# Patient Record
Sex: Male | Born: 1965
Health system: Southern US, Community
[De-identification: ages and names within clinical notes are randomized; demographics above are authoritative.]

## PROBLEM LIST (undated history)

## (undated) DIAGNOSIS — I10 Essential (primary) hypertension: Secondary | ICD-10-CM

## (undated) DIAGNOSIS — K5790 Diverticulosis of intestine, part unspecified, without perforation or abscess without bleeding: Secondary | ICD-10-CM

## (undated) DIAGNOSIS — K219 Gastro-esophageal reflux disease without esophagitis: Secondary | ICD-10-CM

## (undated) HISTORY — PX: HERNIA REPAIR: SHX51

## (undated) HISTORY — DX: Diverticulosis of intestine, part unspecified, without perforation or abscess without bleeding: K57.90

---

## 2006-10-04 ENCOUNTER — Emergency Department (HOSPITAL_COMMUNITY): Admission: EM | Admit: 2006-10-04 | Discharge: 2006-10-04 | Payer: Self-pay | Admitting: Emergency Medicine

## 2007-09-25 ENCOUNTER — Observation Stay (HOSPITAL_COMMUNITY): Admission: EM | Admit: 2007-09-25 | Discharge: 2007-09-25 | Payer: Self-pay | Admitting: Emergency Medicine

## 2007-12-16 ENCOUNTER — Emergency Department (HOSPITAL_COMMUNITY): Admission: EM | Admit: 2007-12-16 | Discharge: 2007-12-17 | Payer: Self-pay | Admitting: Emergency Medicine

## 2008-02-28 ENCOUNTER — Emergency Department (HOSPITAL_COMMUNITY): Admission: EM | Admit: 2008-02-28 | Discharge: 2008-02-28 | Payer: Self-pay | Admitting: Emergency Medicine

## 2008-04-30 ENCOUNTER — Ambulatory Visit (HOSPITAL_BASED_OUTPATIENT_CLINIC_OR_DEPARTMENT_OTHER): Admission: RE | Admit: 2008-04-30 | Discharge: 2008-04-30 | Payer: Self-pay | Admitting: Urology

## 2009-09-01 ENCOUNTER — Encounter: Admission: RE | Admit: 2009-09-01 | Discharge: 2009-09-01 | Payer: Self-pay | Admitting: Gastroenterology

## 2010-05-05 LAB — POCT I-STAT 4, (NA,K, GLUC, HGB,HCT): Sodium: 139 mEq/L (ref 135–145)

## 2010-05-11 LAB — CBC
Hemoglobin: 15.1 g/dL (ref 13.0–17.0)
MCHC: 34.8 g/dL (ref 30.0–36.0)
RBC: 4.6 MIL/uL (ref 4.22–5.81)
RDW: 12.4 % (ref 11.5–15.5)

## 2010-05-11 LAB — URINE MICROSCOPIC-ADD ON

## 2010-05-11 LAB — BASIC METABOLIC PANEL
CO2: 26 mEq/L (ref 19–32)
Chloride: 103 mEq/L (ref 96–112)
GFR calc non Af Amer: >60 mL/min (ref >60)
Glucose, Bld: 130 mg/dL — ABNORMAL HIGH (ref 70–99)
Sodium: 138 mEq/L (ref 135–145)

## 2010-05-11 LAB — URINALYSIS, ROUTINE W REFLEX MICROSCOPIC
Bilirubin Urine: NEGATIVE
Nitrite: NEGATIVE
Urobilinogen, UA: 0.2 mg/dL (ref 0.0–1.0)

## 2010-05-11 LAB — DIFFERENTIAL
Eosinophils Absolute: 0.1 10*3/uL (ref 0.0–0.7)
Eosinophils Relative: 2 % (ref 0–5)
Monocytes Absolute: 0.7 10*3/uL (ref 0.1–1.0)
Monocytes Relative: 10 % (ref 3–12)
Neutro Abs: 4.3 10*3/uL (ref 1.7–7.7)
Neutrophils Relative %: 67 % (ref 43–77)

## 2010-06-08 NOTE — Consult Note (Signed)
NAME:  Michael Peters, Michael Peters NO.:  [id number]   MEDICAL RECORD NO.:  [id number]          PATIENT TYPE:  INP   LOCATION:  1418                         FACILITY:  [facility]   PHYSICIAN:  Francisca December, M.D.  DATE OF BIRTH:  [birth year]   DATE OF CONSULTATION:  09/25/2007  DATE OF DISCHARGE:  09/25/2007                                 CONSULTATION   REASON FOR CONSULT:  Chest pain.   HISTORY OF PRESENT ILLNESS:  Mr. Arp is a 45 year old male patient  with multiple cardiac risk factors including hypertension,  hyperlipidemia, tobacco abuse, as well as an inactive lifestyle.  Yesterday, he developed a sensation of not feeling right and presented  to his primary care doctor who after obtaining an EKG told the patient  that it was not completely normal.  Apparently, his blood pressure was  elevated and he was placed on a lisinopril/hydrochlorothiazide 20/12.5  mg dose and was told to the patient that he would probably need to see a  cardiologist at some point.   The patient then went home.  He then developed chest pressure associated  with diaphoresis, nausea, and anxiety.  He then presented to the  emergency room and his discomfort resolved without any specific reason  for resolution.  No other symptoms.   REVIEW OF SYSTEMS:  Otherwise negative.   PAST MEDICAL HISTORY:  1. Hypertension.  2. Hyperlipidemia.  3. Tobacco abuse.   MEDICATIONS PRIOR TO ADMISSION:  Lisinopril/HCTZ 20/12.5 mg 1 p.o.  daily.   In the hospital, his medications have been:  1. Lopressor 12.5 mg p.o. q.6 h.  2. Lovenox 40 mg subcu q.24 hours.  3. Enteric-coated aspirin 325 mg 1 p.o. daily.   ALLERGIES:  None.   SOCIAL HISTORY:  He smokes 1-pack of cigarettes per day, drinking only  on the weekend.  He denies use of illicit drugs.  He lives in  [place].   FAMILY HISTORY:  The patient is adopted.   PHYSICAL EXAMINATION:  VITAL SIGNS:  Blood pressure 145/80, temp 97.8,  pulse 77,  respirations 14, O2 saturations 99% on room air, and weight  105 kg.  HEENT:  Grossly normal.  No carotid or subclavian bruits.  No JVD or  thyromegaly.  Sclerae clear.  Conjunctivae normal.  Nares without  drainage.  CHEST:  Clear to auscultation bilaterally.  No wheezing or rhonchi.  HEART:  Regular rate and rhythm.  No evidence of murmur.  ABDOMEN:  Good bowel sounds, nontender, nondistended.  No mass.  No  bruit.  LOWER EXTREMITIES:  No lower extremity edema.  SKIN:  Warm and dry.  NEUROLOGIC:  Cranial nerves II through XII grossly intact.  Normal mood  and affect.   Labs today showed BNP of less than 30, total cholesterol 207,  triglycerides 155, HDL 33, and LDL 143.  CK-MB 92 with a MB fraction of  2.0, and troponin 0.02.  There has only been one set of enzyme so far.  Sodium 139, potassium 3.9, BUN 11, and creatinine 0.75.  D-dimer 0.27.  PT 12.8, INR 1.0, and PTT 26.  Point-of-care markers negative x1.  Hemoglobin 15.9, hematocrit 45.5, white count 10.0, and platelets 187.   Chest x-ray, coarse central bronchovascular markings suggesting a viral  process.   ASSESSMENT AND PLAN:  1. Atypical angina with multiple cardiac risk factors.  2. Hypertension.  3. Hyperlipidemia.  4. Tobacco abuse.  5. Inactive lifestyle.  6. Abnormal EKG with inferior Q waves in 2 leads, but no acute ST      abnormality.   Agreed with checking second and third set of enzymes.  If these are  negative, it is okay to discharge him to home from a cardiovascular  standpoint.  He will need outpatient stress Cardiolite later this week.  If his enzymes were positive, we will need to keep him overnight for  diagnostic cardiac catheterization.  Also, recommend exercises as well  as strict heart-healthy diet and probable entire lipid lowering agents  for his hyperlipidemia.  He is to be following up with his primary care  physician, Dr. Deatra Quasean.      Guy Franco, P.A.      Francisca December,  M.D.  Electronically Signed    LB/MEDQ  D:  09/25/2007  T:  09/26/2007  Job:  [id number]   cc:   Deatra Fahad, M.D.  Francisca December, M.D.

## 2010-06-08 NOTE — H&P (Signed)
NAME:  Michael Peters, Michael Peters NO.:  [id number]   MEDICAL RECORD NO.:  [id number]          PATIENT TYPE:  EMS   LOCATION:  ED                           FACILITY:  [facility]   PHYSICIAN:  Michiel Cowboy, MDDATE OF BIRTH:  1965-05-24   DATE OF ADMISSION:  09/25/2007  DATE OF DISCHARGE:                              HISTORY & PHYSICAL   PRIMARY CARE PHYSICIAN:  Deatra Onie, M.D.   CHIEF COMPLAINT:  Chest pain.   HISTORY OF PRESENT ILLNESS:  The patient is a 45 year old male with a  past medical history of hypertension for which he was not taking any  medications up until yesterday when he developed the sensation of not  feeling quite right, although he cannot put his finger on it.  He  describes it as feeling spacey.  He presented to his primary care  doctor, who obtained an EKG and told the patient that it was not  completely normal, started him on lisinopril with hydrochlorothiazide  20/12.5 mg, and also told him that he would probably need to see a  cardiologist at some point.  After the patient went home last night, he  developed chest pressure, which he describes as mild but associated with  diaphoresis, nausea, dry heaving but no vomiting and sensation of  anxiety or tremulousness.  The sensation came and went until he  presented to the emergency department, and resolved when he was in the  emergency department.  Unsure what made it better.  He denies any  shortness of breath associated with it, but it did scare him a lot.  Otherwise, he denies any fevers, chills, vomiting.  He does report some  loose stools x1, but no definite diarrhea or constipation, abdominal  pain.  Neurologically no complaints.  No urinary complaints.  Otherwise,  review of systems x12 is negative, except for as stated above.   PAST MEDICAL HISTORY:  Hypertension.   CURRENT MEDICATIONS:  1. Lisinopril.  2. Hydrochlorothiazide 20/12.5.   ALLERGIES:  None.   SOCIAL HISTORY:  The patient  smokes a pack a day.  Drinks on the  weekends, Bud Light.  Does not use drugs.  Lives at home.   FAMILY HISTORY:  Unable to obtain, as the patient is adopted.   PHYSICAL EXAMINATION:  VITAL SIGNS:  Temperature 97.5, blood pressure  171/68, pulse 92, respirations 18, satting 98% on room air.  GENERAL:  The patient appears to be currently sleeping, in no acute  distress.  HEENT:  Head non-traumatic.  Moist mucous membranes.  No JVD noted.  LUNGS:  Clear to auscultation bilaterally.  HEART:  Regular rate and rhythm.  No murmurs, rubs or gallops are noted.  ABDOMEN:  Soft, nontender, nondistended.  EXTREMITIES:  Lower extremities without any edema.  NEUROLOGIC:  Nonfocal.  Cranial nerves II-XII intact.   LABORATORY DATA:  White blood cell count 10.1, hemoglobin 16.3.  Sodium  149, potassium 3.8, creatinine 0.9.  Cardiac enzymes negative, point of  care markers.  Chest x-ray not obtained.  EKG showing Q waves in leads  III and aVF, but otherwise  unremarkable.   ASSESSMENT AND PLAN:  This is a 45 year old gentleman with risk factors  of smoking and hypertension who comes in with somewhat atypical chest  pain.  1. Chest pain.  Will put on telemetry, obtain serial cardiac markers      and EKG.  __________fasting lipid panel, hemoglobin A1c, TSH.  The      patient will likely need a stress test as an outpatient or      inpatient if chest pain recurs.  2. Hypertension.  Will hold the lisinopril/hydrochlorothiazide and try      __________ beta blocker at this point.  Will also start the patient      on __________.  3. Prophylaxis.  Protonix and Lovenox.      Michiel Cowboy, MD  Electronically Signed     AVD/MEDQ  D:  09/25/2007  T:  09/25/2007  Job:  [id number]   cc:   Deatra Erikson, M.D.  Fax: [phone]

## 2010-06-08 NOTE — Op Note (Signed)
NAME:  Michael Peters, Michael Peters NO.:  [id number]   MEDICAL RECORD NO.:  [id number]          PATIENT TYPE:  AMB   LOCATION:  [facility]                         FACILITY:  [facility]   PHYSICIAN:  Valetta Fuller, M.D.  DATE OF BIRTH:  August 21, 1965   DATE OF PROCEDURE:  DATE OF DISCHARGE:                               OPERATIVE REPORT   PREOPERATIVE DIAGNOSIS:  A 6-mm left distal ureteral calculus.   POSTOPERATIVE DIAGNOSIS:  A 6-mm left distal ureteral calculus.   PROCEDURE PERFORMED:  Cystoscopy, left retrograde pyelogram, left  ureteroscopy, holmium laser lithotripsy, basketing the stone fragments,  and placement of a double-J stent.   SURGEON:  Valetta Fuller, M.D.   ANESTHESIA:  General.   INDICATIONS:  Michael Peters is a 45 year old male.  He has had a documented  6-mm left ureteral stone for approximately 2 months.  He has done fairly  well clinically but has intermittently had mild discomfort and ongoing  hematuria.  Due to failure of spontaneous passage, we have recommended  intervention and suggested ureteroscopy with holmium laser is the most  appropriate therapy.  The patient appeared to understand the advantages  and disadvantages of this treatment and is here now for the procedure.  He has received perioperative antibiotics.   TECHNIQUE AND FINDINGS:  The patient was brought to the operating room.  Appropriate patient identification and operative time-out was performed.  The patient had successful induction of general anesthesia and was  placed in the lithotomy position and prepped and draped in the usual  manner.  The patient had some meatal stenosis which required dilation  with meatal dilators/van Buren sounds.  Cystoscopy then revealed an  unremarkable bladder.  Retrograde pyelogram with a cone-tip catheter  showed a very tight left ureteral orifice.  A 6-mm filling defect was  encountered about 3 cm above the ureterovesical junction with minimal  ureteral dilation.   A guidewire was passed beyond the stone without any  difficulty.  The inside portion of an access sheath was used to perform  one-step ureteral dilation to the level of the stone.  The left distal  ureter was then easily engaged with the rigid ureteroscope and, again, a  6- to 7-mm stone was encountered in the distal ureter.  This was then  placed in a laser basket.  A holmium laser lithotriptor fiber was then  used to break the stone into approximately half a dozen pieces.  The  pieces were then basket extracted and will be sent for stone analysis.  Because of the need for ureteral dilation, we felt it appropriate to  leave a double-J stent.  A 6-French 24-cm double-J stent was then placed  with direct visual guidance as well as fluoroscopic guidance.  A dangle  string was left, which was secured to the patient's penis.  He was  brought to the recovery room in stable condition.      Valetta Fuller, M.D.  Electronically Signed    DSG/MEDQ  D:  04/30/2008  T:  04/30/2008  Job:  [id number]

## 2010-10-18 ENCOUNTER — Other Ambulatory Visit: Payer: Self-pay | Admitting: Family Medicine

## 2010-10-18 DIAGNOSIS — R1032 Left lower quadrant pain: Secondary | ICD-10-CM

## 2010-10-20 ENCOUNTER — Ambulatory Visit
Admission: RE | Admit: 2010-10-20 | Discharge: 2010-10-20 | Disposition: A | Discharge status: Home / Self Care | Payer: No Typology Code available for payment source | Source: Ambulatory Visit | Attending: Family Medicine | Admitting: Family Medicine

## 2010-10-20 DIAGNOSIS — R1032 Left lower quadrant pain: Secondary | ICD-10-CM

## 2010-10-20 MED ORDER — IOHEXOL 300 MG/ML  SOLN: 125.0000 mL | Freq: Once | INTRAMUSCULAR | Status: AC | PRN

## 2010-10-26 LAB — COMPREHENSIVE METABOLIC PANEL
AST: 28
Albumin: 4.2
BUN: 15
Calcium: 9.2
Chloride: 108
Sodium: 141
Total Protein: 6.8

## 2010-10-26 LAB — CBC
Hemoglobin: 15.1
MCV: 95.6
Platelets: 197
RDW: 12.5
WBC: 9.3

## 2010-10-26 LAB — DIFFERENTIAL: Lymphs Abs: 1.8

## 2010-10-26 LAB — POCT CARDIAC MARKERS
CKMB, poc: 1.3
Troponin i, poc: <0.05
Troponin i, poc: <0.05

## 2010-10-27 LAB — LIPID PANEL
HDL: 33 — ABNORMAL LOW
LDL Cholesterol: 143 — ABNORMAL HIGH
Total CHOL/HDL Ratio: 6.3
Triglycerides: 155 — ABNORMAL HIGH
VLDL: 31

## 2010-10-27 LAB — DIFFERENTIAL
Basophils Relative: 0
Eosinophils Relative: 1
Lymphocytes Relative: 16
Lymphs Abs: 1.6
Monocytes Absolute: 0.7
Neutrophils Relative %: 76

## 2010-10-27 LAB — COMPREHENSIVE METABOLIC PANEL
Alkaline Phosphatase: 73
BUN: 11
Calcium: 9.4
Glucose, Bld: 106 — ABNORMAL HIGH
Potassium: 3.9
Total Protein: 6.9

## 2010-10-27 LAB — D-DIMER, QUANTITATIVE: D-Dimer, Quant: 0.27

## 2010-10-27 LAB — CBC
HCT: 45.5
MCHC: 35.1
Platelets: 187
RBC: 4.78
RDW: 12.6

## 2010-10-27 LAB — CARDIAC PANEL(CRET KIN+CKTOT+MB+TROPI)
CK, MB: 1.7
Total CK: 74
Total CK: 83

## 2010-10-27 LAB — POCT I-STAT, CHEM 8
BUN: 12
Chloride: 106
Glucose, Bld: 108 — ABNORMAL HIGH
HCT: 48
TCO2: 25

## 2010-10-27 LAB — HEMOGLOBIN A1C: Hgb A1c MFr Bld: 5.5

## 2010-10-27 LAB — POCT CARDIAC MARKERS: CKMB, poc: 1.3

## 2010-10-27 LAB — PROTIME-INR: INR: 1

## 2010-10-27 LAB — CK TOTAL AND CKMB (NOT AT ARMC)
Relative Index: INVALID
Total CK: 92

## 2013-06-25 ENCOUNTER — Other Ambulatory Visit: Payer: Self-pay | Admitting: Gastroenterology

## 2013-06-25 DIAGNOSIS — R7989 Other specified abnormal findings of blood chemistry: Secondary | ICD-10-CM

## 2013-06-25 DIAGNOSIS — R945 Abnormal results of liver function studies: Secondary | ICD-10-CM

## 2013-07-01 ENCOUNTER — Other Ambulatory Visit: Payer: Self-pay | Admitting: Gastroenterology

## 2013-07-01 ENCOUNTER — Other Ambulatory Visit: Payer: 59

## 2013-07-01 DIAGNOSIS — R7989 Other specified abnormal findings of blood chemistry: Secondary | ICD-10-CM

## 2013-07-01 DIAGNOSIS — R945 Abnormal results of liver function studies: Secondary | ICD-10-CM

## 2013-07-02 ENCOUNTER — Encounter (INDEPENDENT_AMBULATORY_CARE_PROVIDER_SITE_OTHER): Payer: Self-pay

## 2013-07-02 ENCOUNTER — Ambulatory Visit
Admission: RE | Admit: 2013-07-02 | Discharge: 2013-07-02 | Disposition: A | Discharge status: Home / Self Care | Payer: 59 | Source: Ambulatory Visit | Attending: Gastroenterology | Admitting: Gastroenterology

## 2013-07-02 DIAGNOSIS — R7989 Other specified abnormal findings of blood chemistry: Secondary | ICD-10-CM

## 2013-07-02 DIAGNOSIS — R945 Abnormal results of liver function studies: Secondary | ICD-10-CM

## 2014-10-08 ENCOUNTER — Encounter (HOSPITAL_COMMUNITY): Payer: Self-pay

## 2014-10-08 ENCOUNTER — Emergency Department (HOSPITAL_COMMUNITY)
Admission: EM | Admit: 2014-10-08 | Discharge: 2014-10-08 | Disposition: A | Discharge status: Home / Self Care | Payer: 59 | Attending: Emergency Medicine | Admitting: Emergency Medicine

## 2014-10-08 ENCOUNTER — Emergency Department (HOSPITAL_COMMUNITY): Payer: 59

## 2014-10-08 DIAGNOSIS — Z7982 Long term (current) use of aspirin: Secondary | ICD-10-CM | POA: Insufficient documentation

## 2014-10-08 DIAGNOSIS — Z79899 Other long term (current) drug therapy: Secondary | ICD-10-CM | POA: Diagnosis not present

## 2014-10-08 DIAGNOSIS — Z8719 Personal history of other diseases of the digestive system: Secondary | ICD-10-CM | POA: Diagnosis not present

## 2014-10-08 DIAGNOSIS — R079 Chest pain, unspecified: Secondary | ICD-10-CM | POA: Diagnosis present

## 2014-10-08 DIAGNOSIS — R1013 Epigastric pain: Secondary | ICD-10-CM | POA: Insufficient documentation

## 2014-10-08 DIAGNOSIS — R0789 Other chest pain: Secondary | ICD-10-CM | POA: Insufficient documentation

## 2014-10-08 HISTORY — DX: Gastro-esophageal reflux disease without esophagitis: K21.9

## 2014-10-08 LAB — BASIC METABOLIC PANEL
ANION GAP: 10 (ref 5–15)
BUN: 17 mg/dL (ref 6–20)
CALCIUM: 9.8 mg/dL (ref 8.9–10.3)
CO2: 25 mmol/L (ref 22–32)
Chloride: 106 mmol/L (ref 101–111)
Creatinine, Ser: 0.76 mg/dL (ref 0.61–1.24)
Glucose, Bld: 94 mg/dL (ref 65–99)
Potassium: 4 mmol/L (ref 3.5–5.1)
SODIUM: 141 mmol/L (ref 135–145)

## 2014-10-08 LAB — CBC
HCT: 43.5 % (ref 39.0–52.0)
HEMOGLOBIN: 15.2 g/dL (ref 13.0–17.0)
MCH: 32.8 pg (ref 26.0–34.0)
MCHC: 34.9 g/dL (ref 30.0–36.0)
MCV: 94 fL (ref 78.0–100.0)
Platelets: 184 10*3/uL (ref 150–400)
RBC: 4.63 MIL/uL (ref 4.22–5.81)
RDW: 13.2 % (ref 11.5–15.5)
WBC: 5.1 10*3/uL (ref 4.0–10.5)

## 2014-10-08 LAB — TROPONIN I: Troponin I: <0.03 ng/mL (ref <0.031)

## 2014-10-08 LAB — I-STAT TROPONIN, ED: TROPONIN I, POC: 0.01 ng/mL (ref 0.00–0.08)

## 2014-10-08 NOTE — Discharge Instructions (Signed)

## 2014-10-08 NOTE — ED Provider Notes (Signed)
CSN: [id number]     Arrival date & time 10/08/14  0755 History   First MD Initiated Contact with Patient 10/08/14 0820     Chief Complaint  Patient presents with  . Chest Pain     (Consider location/radiation/quality/duration/timing/severity/associated sxs/prior Treatment) Patient is a 49 y.o. male presenting with chest pain.  Chest Pain Pain location:  L chest and substernal area Pain quality: sharp   Pain radiates to:  Mid back Pain severity:  Moderate Onset quality:  Gradual Duration:  2 days Timing:  Constant Progression:  Worsening Chronicity:  New Context comment:  Went on a long hike a few days ago which pt describes as taxing.  Went on another hike yesterday Relieved by:  Nothing Exacerbated by: movement, palpation, deep breaths. Ineffective treatments:  None tried Associated symptoms: abdominal pain (yesterday had some epigastric pain)   Associated symptoms: no cough, no diaphoresis, no dizziness, no fever, no lower extremity edema, no shortness of breath and not vomiting     Past Medical History  Diagnosis Date  . GERD (gastroesophageal reflux disease)    No past surgical history on file. No family history on file. Social History  Substance Use Topics  . Smoking status: Never Smoker   . Smokeless tobacco: Not on file  . Alcohol Use: No    Review of Systems  Constitutional: Negative for fever and diaphoresis.  Respiratory: Negative for cough and shortness of breath.   Cardiovascular: Positive for chest pain.  Gastrointestinal: Positive for abdominal pain (yesterday had some epigastric pain). Negative for vomiting.  Neurological: Negative for dizziness.  All other systems reviewed and are negative.     Allergies  Zestoretic  Home Medications   Prior to Admission medications   Medication Sig Start Date End Date Taking? Authorizing Provider  amLODipine (NORVASC) 5 MG tablet Take 5 mg by mouth daily.  10/07/14  Yes Historical Provider, MD  aspirin 325  MG tablet Take 650 mg by mouth daily.   Yes Historical Provider, MD  BENICAR 40 MG tablet TAKE 1 TABLET EVERY DAY 09/18/14  Yes Historical Provider, MD   BP 151/80 mmHg  Pulse 63  Temp(Src) 98 F (36.7 C) (Oral)  Resp 24  Wt 246 lb (111.585 kg)  SpO2 93% Physical Exam  Constitutional: He is oriented to person, place, and time. He appears well-developed and well-nourished. No distress.  HENT:  Head: Normocephalic and atraumatic.  Mouth/Throat: Oropharynx is clear and moist.  Eyes: Conjunctivae are normal. Pupils are equal, round, and reactive to light. No scleral icterus.  Neck: Neck supple.  Cardiovascular: Normal rate, regular rhythm, normal heart sounds and intact distal pulses.   No murmur heard. Pulmonary/Chest: Effort normal and breath sounds normal. No stridor. No respiratory distress. He has no wheezes. He has no rales. He exhibits tenderness (left anterior chest wall).  Abdominal: Soft. He exhibits no distension. There is no tenderness.  Musculoskeletal: Normal range of motion. He exhibits no edema.  Neurological: He is alert and oriented to person, place, and time.  Skin: Skin is warm and dry. No rash noted.  Psychiatric: He has a normal mood and affect. His behavior is normal.  Nursing note and vitals reviewed.   ED Course  Procedures (including critical care time) Labs Review Labs Reviewed  BASIC METABOLIC PANEL  CBC  TROPONIN I  Rosezena Sensor, ED    Imaging Review Dg Chest 2 View  10/08/2014   CLINICAL DATA:  Two day history of chest pain  EXAM: CHEST  2 VIEW  COMPARISON:  December 16, 2007  FINDINGS: Lungs are clear. Heart size and pulmonary vascularity are normal. There is an azygos lobe on the right, an anatomic variant. No adenopathy. No pneumothorax. There is spina bifida occulta at T1, stable.  IMPRESSION: No edema or consolidation.   Electronically Signed   By: Bretta Bang III M.D.   On: 10/08/2014 08:24   I have personally reviewed and evaluated  these images and lab results as part of my medical decision-making.   EKG Interpretation None      EKG - NSR, rate 70, normal axis, normal intervals, no ST/T abnormality. No priors for comparison.    MDM   Final diagnoses:  Musculoskeletal chest pain     History and exam are consistent with MSK chest pain, likely secondary to his recent exertion.  Pain atypical for ACS.  I have a low suspicion for PE and he is PERC negative.    Delta troponin negative.  He appears safe for dc with outpatient follow up.    Blake Divine, MD 10/09/14 [phone]

## 2014-10-08 NOTE — ED Notes (Signed)
He c/o central chest discomfort which began yesterday afternoon.  He cites a similar episode about two years ago at which time he was dx with GERD.  He states he became concerned this morning when the discomfort began to radiate to left chest and upper back.  He states he took 2 adult asa before arrival this morning.  He also cites being on a very low calorie diet and having exerted himself "long hike" a couple of days ago at which time he describes a near-syncopal episode.

## 2015-05-29 ENCOUNTER — Other Ambulatory Visit: Payer: Self-pay | Admitting: Internal Medicine

## 2015-05-29 DIAGNOSIS — R221 Localized swelling, mass and lump, neck: Secondary | ICD-10-CM

## 2015-06-18 ENCOUNTER — Ambulatory Visit
Admission: RE | Admit: 2015-06-18 | Discharge: 2015-06-18 | Disposition: A | Discharge status: Home / Self Care | Payer: 59 | Source: Ambulatory Visit | Attending: Internal Medicine | Admitting: Internal Medicine

## 2015-06-18 DIAGNOSIS — R221 Localized swelling, mass and lump, neck: Secondary | ICD-10-CM

## 2015-08-07 ENCOUNTER — Other Ambulatory Visit: Payer: Self-pay | Admitting: Internal Medicine

## 2015-08-07 DIAGNOSIS — N5082 Scrotal pain: Secondary | ICD-10-CM

## 2015-08-12 ENCOUNTER — Other Ambulatory Visit: Payer: Self-pay | Admitting: Gastroenterology

## 2015-08-12 DIAGNOSIS — R1032 Left lower quadrant pain: Secondary | ICD-10-CM

## 2015-08-19 ENCOUNTER — Other Ambulatory Visit: Payer: 59

## 2015-09-04 ENCOUNTER — Ambulatory Visit
Admission: RE | Admit: 2015-09-04 | Discharge: 2015-09-04 | Disposition: A | Discharge status: Home / Self Care | Payer: 59 | Source: Ambulatory Visit | Attending: Gastroenterology | Admitting: Gastroenterology

## 2015-09-04 DIAGNOSIS — R1032 Left lower quadrant pain: Secondary | ICD-10-CM

## 2015-09-04 MED ORDER — IOPAMIDOL (ISOVUE-300) INJECTION 61%
125.0000 mL | Freq: Once | INTRAVENOUS | Status: AC | PRN
  Administered 2015-09-04: 125 mL via INTRAVENOUS

## 2016-01-14 ENCOUNTER — Ambulatory Visit (INDEPENDENT_AMBULATORY_CARE_PROVIDER_SITE_OTHER): Payer: 59 | Admitting: Emergency Medicine

## 2016-01-14 VITALS — BP 142/86 | HR 94 | Temp 98.8°F | Resp 16 | Ht 67.0 in | Wt 264.0 lb

## 2016-01-14 DIAGNOSIS — R1030 Lower abdominal pain, unspecified: Secondary | ICD-10-CM

## 2016-01-14 DIAGNOSIS — Z23 Encounter for immunization: Secondary | ICD-10-CM | POA: Diagnosis not present

## 2016-01-14 LAB — POC MICROSCOPIC URINALYSIS (UMFC): Mucus: ABSENT

## 2016-01-14 MED ORDER — DICYCLOMINE HCL 20 MG PO TABS: 20.0000 mg | ORAL_TABLET | Freq: Three times a day (TID) | ORAL | Status: AC | PRN

## 2016-01-14 NOTE — Patient Instructions (Addendum)
     IF you received an x-ray today, you will receive an invoice from [facility]. Please contact [facility] at [phone] with questions or concerns regarding your invoice.   IF you received labwork today, you will receive an invoice from [facility]. Please contact [facility] at [phone] with questions or concerns regarding your invoice.   Our billing staff will not be able to assist you with questions regarding bills from these companies.  You will be contacted with the lab results as soon as they are available. The fastest way to get your results is to activate your My Chart account. Instructions are located on the last page of this paperwork. If you have not heard from us regarding the results in 2 weeks, please contact this office.      Abdominal Pain, Adult Abdominal pain can be caused by many things. Often, abdominal pain is not serious and it gets better with no treatment or by being treated at home. However, sometimes abdominal pain is serious. Your health care provider will do a medical history and a physical exam to try to determine the cause of your abdominal pain. Follow these instructions at home:  Take over-the-counter and prescription medicines only as told by your health care provider. Do not take a laxative unless told by your health care provider.  Drink enough fluid to keep your urine clear or pale yellow.  Watch your condition for any changes.  Keep all follow-up visits as told by your health care provider. This is important. Contact a health care provider if:  Your abdominal pain changes or gets worse.  You are not hungry or you lose weight without trying.  You are constipated or have diarrhea for more than 2-3 days.  You have pain when you urinate or have a bowel movement.  Your abdominal pain wakes you up at night.  Your pain gets worse with meals, after eating, or with certain foods.  You are throwing up and cannot keep anything  down.  You have a fever. Get help right away if:  Your pain does not go away as soon as your health care provider told you to expect.  You cannot stop throwing up.  Your pain is only in areas of the abdomen, such as the right side or the left lower portion of the abdomen.  You have bloody or black stools, or stools that look like tar.  You have severe pain, cramping, or bloating in your abdomen.  You have signs of dehydration, such as:  Dark urine, very little urine, or no urine.  Cracked lips.  Dry mouth.  Sunken eyes.  Sleepiness.  Weakness. This information is not intended to replace advice given to you by your health care provider. Make sure you discuss any questions you have with your health care provider. Document Released: 10/20/2004 Document Revised: 07/31/2015 Document Reviewed: 06/24/2015 Elsevier Interactive Patient Education  2017 [employer]vier Inc.  

## 2016-01-14 NOTE — Progress Notes (Signed)
Michael Peters 50 y.o.   Chief Complaint  Patient presents with  . Abdominal Pain    lower, yesterday  . Nausea  . Dysuria    HISTORY OF PRESENT ILLNESS: This is a 50 y.o. male complaining of lower abdominal pain since yesterday. Sharp cramping pain lasting 30 seconds. Abdominal Pain  This is a new problem. The current episode started yesterday. The problem occurs intermittently. The most recent episode lasted 1 day. The problem has been resolved. The pain is located in the suprapubic region. The pain is at a severity of 0/10. The patient is experiencing no pain. The quality of the pain is colicky and cramping. The abdominal pain radiates to the suprapubic region. Pertinent negatives include no anorexia, arthralgias, belching, constipation, diarrhea, dysuria, fever, frequency, headaches, hematochezia, hematuria, melena, myalgias, nausea, vomiting or weight loss. Nothing aggravates the pain. The pain is relieved by nothing. He has tried nothing for the symptoms. Prior diagnostic workup includes CT scan.  Dysuria   Pertinent negatives include no chills, flank pain, frequency, hematuria, nausea, urgency or vomiting.  Ct scan A/P done last summer showed diverticulosis. Prior to Admission medications   Medication Sig Start Date End Date Taking? Authorizing Provider  aspirin 325 MG tablet Take 650 mg by mouth daily.   Yes Historical Provider, MD  BENICAR 40 MG tablet TAKE 1 TABLET EVERY DAY 09/18/14  Yes Historical Provider, MD  Probiotic Product (PROBIOTIC PO) Take by mouth.   Yes Historical Provider, MD  amLODipine (NORVASC) 5 MG tablet Take 5 mg by mouth daily.  10/07/14   Historical Provider, MD    Allergies  Allergen Reactions  . Zestoretic [Lisinopril-Hydrochlorothiazide] Anxiety    Racing heart    There are no active problems to display for this patient.   Past Medical History:  Diagnosis Date  . Diverticulosis   . GERD (gastroesophageal reflux disease)     Past Surgical History:   Procedure Laterality Date  . HERNIA REPAIR     bilateral inguinal    Social History   Social History  . Marital status: Single    Spouse name: N/A  . Number of children: N/A  . Years of education: N/A   Occupational History  . Not on file.   Social History Main Topics  . Smoking status: Former Games developermoker  . Smokeless tobacco: Not on file  . Alcohol use No  . Drug use: Unknown  . Sexual activity: Not on file   Other Topics Concern  . Not on file   Social History Narrative  . No narrative on file    No family history on file. Results for orders placed or performed in visit on 01/14/16  POCT Microscopic Urinalysis ([facility])  Result Value Ref Range   WBC,UR,HPF,POC None None WBC/hpf   RBC,UR,HPF,POC Few (A) None RBC/hpf   Bacteria None None, Too numerous to count   Mucus Absent Absent   Epithelial Cells, UR Per Microscopy Few (A) None, Too numerous to count cells/hpf     Review of Systems  Constitutional: Negative for chills, fever and weight loss.  HENT: Negative.  Negative for ear discharge.   Eyes: Negative for blurred vision, double vision and discharge.  Respiratory: Negative for cough, hemoptysis, shortness of breath and wheezing.   Cardiovascular: Negative for chest pain, palpitations, claudication and leg swelling.  Gastrointestinal: Positive for abdominal pain. Negative for anorexia, blood in stool, constipation, diarrhea, hematochezia, melena, nausea and vomiting.  Genitourinary: Negative for dysuria, flank pain, frequency, hematuria and urgency.  Musculoskeletal: Negative for arthralgias, back pain and myalgias.  Skin: Negative for rash.  Neurological: Negative for dizziness, sensory change and headaches.  Endo/Heme/Allergies: Negative.  Does not bruise/bleed easily.  Psychiatric/Behavioral: Negative.   All other systems reviewed and are negative.    Physical Exam   ASSESSMENT & PLAN: 1. Need for influenza vaccination  - Flu Vaccine QUAD 36+ mos  IM - POCT Microscopic Urinalysis ([facility]) - CBC with Differential/Platelet - Comprehensive metabolic panel - Care order/instruction:  2. Lower abdominal pain No red flag symptoms. - POCT Microscopic Urinalysis ([facility]) - CBC with Differential/Platelet - Comprehensive metabolic panel - dicyclomine (BENTYL) tablet 20 mg; Take 1 tablet (20 mg total) by mouth 3 (three) times daily with meals as needed for spasms. - Care order/instruction:  Abdominal pain instructions given. F/u with PCP/[facility] as needed.  Michael BarthMiguel Ricci Paff, MD [facility]

## 2016-01-15 LAB — COMPREHENSIVE METABOLIC PANEL
ALT: 40 IU/L (ref 0–44)
AST: 23 IU/L (ref 0–40)
Albumin/Globulin Ratio: 1.6 (ref 1.2–2.2)
Albumin: 4.6 g/dL (ref 3.5–5.5)
Alkaline Phosphatase: 78 IU/L (ref 39–117)
BUN/Creatinine Ratio: 19 (ref 9–20)
BUN: 14 mg/dL (ref 6–24)
Bilirubin Total: 1.3 mg/dL — ABNORMAL HIGH (ref 0.0–1.2)
CALCIUM: 9.6 mg/dL (ref 8.7–10.2)
CO2: 20 mmol/L (ref 18–29)
CREATININE: 0.72 mg/dL — AB (ref 0.76–1.27)
Chloride: 98 mmol/L (ref 96–106)
GFR calc Af Amer: 127 mL/min/{1.73_m2} (ref >59)
GFR, EST NON AFRICAN AMERICAN: 110 mL/min/{1.73_m2} (ref >59)
Globulin, Total: 2.8 g/dL (ref 1.5–4.5)
Glucose: 91 mg/dL (ref 65–99)
Potassium: 3.8 mmol/L (ref 3.5–5.2)
Sodium: 140 mmol/L (ref 134–144)
Total Protein: 7.4 g/dL (ref 6.0–8.5)

## 2016-01-15 LAB — CBC WITH DIFFERENTIAL/PLATELET
Basophils Absolute: 0 10*3/uL (ref 0.0–0.2)
Basos: 0 %
EOS (ABSOLUTE): 0.1 10*3/uL (ref 0.0–0.4)
EOS: 1 %
HEMATOCRIT: 43.5 % (ref 37.5–51.0)
Hemoglobin: 15.6 g/dL (ref 13.0–17.7)
IMMATURE GRANULOCYTES: 0 %
Immature Grans (Abs): 0 10*3/uL (ref 0.0–0.1)
LYMPHS: 10 %
Lymphocytes Absolute: 1 10*3/uL (ref 0.7–3.1)
MCH: 32 pg (ref 26.6–33.0)
MCHC: 35.9 g/dL — ABNORMAL HIGH (ref 31.5–35.7)
MCV: 89 fL (ref 79–97)
MONOS ABS: 1 10*3/uL — AB (ref 0.1–0.9)
Monocytes: 10 %
NEUTROS PCT: 79 %
Neutrophils Absolute: 8.5 10*3/uL — ABNORMAL HIGH (ref 1.4–7.0)
PLATELETS: 191 10*3/uL (ref 150–379)
RBC: 4.87 x10E6/uL (ref 4.14–5.80)
RDW: 12.9 % (ref 12.3–15.4)
WBC: 10.7 10*3/uL (ref 3.4–10.8)

## 2016-01-15 MED ORDER — DICYCLOMINE HCL 20 MG PO TABS: 20.0000 mg | ORAL_TABLET | Freq: Three times a day (TID) | ORAL | 0 refills | Status: DC

## 2016-01-15 NOTE — Addendum Note (Signed)
Addended by: Clarene CritchleyKOLLER, KAREN M on: 01/15/2016 08:17 AM   Modules accepted: Orders

## 2016-03-30 DIAGNOSIS — I10 Essential (primary) hypertension: Secondary | ICD-10-CM | POA: Diagnosis not present

## 2016-03-30 DIAGNOSIS — K30 Functional dyspepsia: Secondary | ICD-10-CM | POA: Diagnosis not present

## 2016-05-19 DIAGNOSIS — I10 Essential (primary) hypertension: Secondary | ICD-10-CM | POA: Diagnosis not present

## 2016-05-19 DIAGNOSIS — R42 Dizziness and giddiness: Secondary | ICD-10-CM | POA: Diagnosis not present

## 2016-06-03 ENCOUNTER — Ambulatory Visit (INDEPENDENT_AMBULATORY_CARE_PROVIDER_SITE_OTHER): Payer: 59

## 2016-06-03 ENCOUNTER — Ambulatory Visit (INDEPENDENT_AMBULATORY_CARE_PROVIDER_SITE_OTHER): Payer: 59 | Admitting: Family Medicine

## 2016-06-03 VITALS — BP 163/95 | HR 66 | Temp 98.0°F | Resp 18 | Ht 69.69 in | Wt 255.6 lb

## 2016-06-03 DIAGNOSIS — Z87891 Personal history of nicotine dependence: Secondary | ICD-10-CM | POA: Diagnosis not present

## 2016-06-03 DIAGNOSIS — R0981 Nasal congestion: Secondary | ICD-10-CM

## 2016-06-03 DIAGNOSIS — J4521 Mild intermittent asthma with (acute) exacerbation: Secondary | ICD-10-CM | POA: Diagnosis not present

## 2016-06-03 DIAGNOSIS — R42 Dizziness and giddiness: Secondary | ICD-10-CM

## 2016-06-03 DIAGNOSIS — R0789 Other chest pain: Secondary | ICD-10-CM

## 2016-06-03 LAB — POCT URINALYSIS DIP (MANUAL ENTRY)
Bilirubin, UA: NEGATIVE
Glucose, UA: NEGATIVE mg/dL
Ketones, POC UA: NEGATIVE mg/dL
Leukocytes, UA: NEGATIVE
NITRITE UA: NEGATIVE
PROTEIN UA: NEGATIVE mg/dL
SPEC GRAV UA: 1.02 (ref 1.010–1.025)
UROBILINOGEN UA: 0.2 U/dL
pH, UA: 5.5 (ref 5.0–8.0)

## 2016-06-03 LAB — POCT CBC
GRANULOCYTE PERCENT: 73.2 % (ref 37–80)
HCT, POC: 44.8 % (ref 43.5–53.7)
Hemoglobin: 15.6 g/dL (ref 14.1–18.1)
Lymph, poc: 1.7 (ref 0.6–3.4)
MCH, POC: 33 pg — AB (ref 27–31.2)
MCHC: 34.7 g/dL (ref 31.8–35.4)
MCV: 94.8 fL (ref 80–97)
MID (cbc): 0.2 (ref 0–0.9)
MPV: 7.1 fL (ref 0–99.8)
PLATELET COUNT, POC: 217 10*3/uL (ref 142–424)
POC Granulocyte: 5.1 (ref 2–6.9)
POC LYMPH %: 24.1 % (ref 10–50)
POC MID %: 2.7 %M (ref 0–12)
RBC: 4.72 M/uL (ref 4.69–6.13)
RDW, POC: 13.2 %
WBC: 6.9 10*3/uL (ref 4.6–10.2)

## 2016-06-03 LAB — TROPONIN I

## 2016-06-03 MED ORDER — IPRATROPIUM BROMIDE 0.03 % NA SOLN: 2.0000 | Freq: Four times a day (QID) | NASAL | 1 refills | Status: DC

## 2016-06-03 MED ORDER — IPRATROPIUM BROMIDE 0.02 % IN SOLN
0.5000 mg | Freq: Once | RESPIRATORY_TRACT | Status: AC
  Administered 2016-06-03: 0.5 mg via RESPIRATORY_TRACT

## 2016-06-03 MED ORDER — ALBUTEROL SULFATE (2.5 MG/3ML) 0.083% IN NEBU
2.5000 mg | INHALATION_SOLUTION | Freq: Once | RESPIRATORY_TRACT | Status: AC
  Administered 2016-06-03: 2.5 mg via RESPIRATORY_TRACT

## 2016-06-03 MED ORDER — ALBUTEROL SULFATE HFA 108 (90 BASE) MCG/ACT IN AERS: 2.0000 | INHALATION_SPRAY | RESPIRATORY_TRACT | 1 refills | Status: DC | PRN

## 2016-06-03 MED ORDER — TRIAMCINOLONE ACETONIDE 55 MCG/ACT NA AERO
2.0000 | INHALATION_SPRAY | Freq: Every day | NASAL | 12 refills | Status: DC
Start: 2016-06-03 — End: 2022-07-13

## 2016-06-03 MED ORDER — ALBUTEROL SULFATE (2.5 MG/3ML) 0.083% IN NEBU: 2.5000 mg | INHALATION_SOLUTION | Freq: Once | RESPIRATORY_TRACT | Status: DC

## 2016-06-03 MED ORDER — CETIRIZINE HCL 10 MG PO TABS: 10.0000 mg | ORAL_TABLET | Freq: Every day | ORAL | 11 refills | Status: DC

## 2016-06-03 NOTE — Progress Notes (Signed)
06/03/2016 11:59 AM   DOB: [birth year] / MRN: [id number]  SUBJECTIVE:  Michael Peters is a 51 y.o. male presenting for lightheadedness that started 4 weeks ago.  He was seen by his PCP for this who started HCTZ.  Tells me the BP is good since then and he has been checking this measuring 123/66 this morning and 141/85 yesterday while he was feeling lightheaded.  Tells me that feels shortness of breath when standing and talking to people and this this is secondary to pollen.  Has tried mucinex, antihistamine eye drops, Nasacort nose spray for about a week and tells me he could not really tell any difference.  He has also been using oxymetazoline and his last dose was yesterday at lunch.   Tells me he has been hypertensive for years. Dyslipidemia.  He smoked for 25 years at 1 pack a day.   He is allergic to zestoretic [lisinopril-hydrochlorothiazide].   He  has a past medical history of Diverticulosis and GERD (gastroesophageal reflux disease).    He  reports that he has quit smoking. He does not have any smokeless tobacco history on file. He reports that he does not drink alcohol. He  has no sexual activity history on file. The patient  has a past surgical history that includes Hernia repair.  His family history is not on file.  Review of Systems  Constitutional: Negative for chills, diaphoresis, fever and malaise/fatigue.  HENT: Positive for congestion.   Respiratory: Positive for shortness of breath. Negative for cough and sputum production.   Cardiovascular: Negative for chest pain, orthopnea and leg swelling.  Gastrointestinal: Negative for nausea.  Neurological: Positive for dizziness (fogginess (packed in wool)) and headaches (bitemporal, constant, mild). Negative for weakness.    The problem list and medications were reviewed and updated by myself where necessary and exist elsewhere in the encounter.   OBJECTIVE:  BP (!) 163/95   Pulse 66   Temp 98 F (36.7 C) (Oral)   Resp 18   Ht  5' 9.69" (1.77 m)   Wt 255 lb 9.6 oz (115.9 kg)   SpO2 97%   BMI 37.01 kg/m   Physical Exam  Constitutional: He appears well-developed. He is active and cooperative.  Non-toxic appearance.  Cardiovascular: Normal rate, regular rhythm, S1 normal, S2 normal, normal heart sounds, intact distal pulses and normal pulses.  Exam reveals no gallop and no friction rub.   No murmur heard. Pulmonary/Chest: Effort normal. No stridor. No tachypnea. No respiratory distress. He has no wheezes. He has no rales.  Abdominal: He exhibits no distension.  Musculoskeletal: He exhibits no edema.  Neurological: He is alert.  Skin: Skin is warm and dry. He is not diaphoretic. No pallor.  Vitals reviewed.   Lab Results  Component Value Date   CHOL (H) 09/25/2007    207        ATP III CLASSIFICATION:  <200     mg/dL   Desirable  409-811  mg/dL   Borderline High  >=914    mg/dL   High   HDL 33 (L) 78/29/5621   LDLCALC (H) 09/25/2007    143        Total Cholesterol/HDL:CHD Risk Coronary Heart Disease Risk Table                     Men   Women  1/2 Average Risk   3.4   3.3   TRIG 155 (H) 09/25/2007   CHOLHDL 6.3 09/25/2007   Lab  Results  Component Value Date   HGBA1C  09/25/2007    5.5 (NOTE)   The ADA recommends the following therapeutic goal for glycemic   control related to Hgb A1C measurement:   Goal of Therapy:   < 7.0% Hgb A1C   Reference: American Diabetes Association: Clinical Practice   Recommendations 2008, Diabetes Care,  2008, 31:(Suppl 1).   Results for orders placed or performed in visit on 06/03/16 (from the past 72 hour(s))  POCT CBC     Status: Abnormal   Collection Time: 06/03/16 11:39 AM  Result Value Ref Range   WBC 6.9 4.6 - 10.2 K/uL   Lymph, poc 1.7 0.6 - 3.4   POC LYMPH PERCENT 24.1 10 - 50 %L   MID (cbc) 0.2 0 - 0.9   POC MID % 2.7 0 - 12 %M   POC Granulocyte 5.1 2 - 6.9   Granulocyte percent 73.2 37 - 80 %G   RBC 4.72 4.69 - 6.13 M/uL   Hemoglobin 15.6 14.1 - 18.1  g/dL   HCT, POC 13.044.8 86.543.5 - 53.7 %   MCV 94.8 80 - 97 fL   MCH, POC 33.0 (A) 27 - 31.2 pg   MCHC 34.7 31.8 - 35.4 g/dL   RDW, POC 78.413.2 %   Platelet Count, POC 217 142 - 424 K/uL   MPV 7.1 0 - 99.8 fL  POCT urinalysis dipstick     Status: Abnormal   Collection Time: 06/03/16 11:44 AM  Result Value Ref Range   Color, UA yellow yellow   Clarity, UA clear clear   Glucose, UA negative negative mg/dL   Bilirubin, UA negative negative   Ketones, POC UA negative negative mg/dL   Spec Grav, UA 6.9621.020 9.5281.010 - 1.025   Blood, UA trace-intact (A) negative   pH, UA 5.5 5.0 - 8.0   Protein Ur, POC negative negative mg/dL   Urobilinogen, UA 0.2 0.2 or 1.0 E.U./dL   Nitrite, UA Negative Negative   Leukocytes, UA Negative Negative    Dg Chest 2 View  Result Date: 06/03/2016 CLINICAL DATA:  Dizziness and hypertension EXAM: CHEST  2 VIEW COMPARISON:  None. FINDINGS: Lungs are clear. Heart size and pulmonary vascularity are normal. No adenopathy. No bone lesions. There is an azygos lobe on the right, an anatomic variant. IMPRESSION: No edema or consolidation. Electronically Signed   By: Bretta BangWilliam  Woodruff III M.D.   On: 06/03/2016 11:38    ASSESSMENT AND PLAN:  Fayrene FearingJames was seen today for nasal congestion.  Diagnoses and all orders for this visit:  Dizziness: I have spolen with Dr. Clelia CroftShaw who has also seen the patient.  We agree that he needs to see cardiology however the question remains how soon.  Troponin is pending at this time. I defer to her decision making regarding his care.  -     EKG 12-Lead -     DG Chest 2 View; Future -     POCT CBC -     POCT urinalysis dipstick -     Basic metabolic panel -     TSH -     albuterol (PROVENTIL) (2.5 MG/3ML) 0.083% nebulizer solution 2.5 mg; Take 3 mLs (2.5 mg total) by nebulization once. -     Troponin I -     Orthostatic vital signs    The patient is advised to call or return to clinic if he does not see an improvement in symptoms, or to seek the  care of the  closest emergency department if he worsens with the above plan.   Deliah Boston, MHS, PA-C [facility] 06/03/2016 11:59 AM

## 2016-06-03 NOTE — Progress Notes (Signed)
Subjective:    Patient ID: Michael DoveJames Peters, male    DOB: 11/01/1965, 51 y.o.   MRN: [id number] Chief Complaint  Patient presents with  . Nasal Congestion    with some chest congestion and headache. Pt states he has had some dizzy spells x 1 week.     HPI  Michael Peters is a 51 yo male who presents with 1 month of gradually worsening dizzy episodes. They started initially after he spent an afternoon staring at a smaller computer. They occur almost exclusively in the afternoon and start with 10 minutes of feeling foggy and fuzzyheaded with some lightheadedness. He denies any vertigo but does normally sit down to avoid a chance of falling. Not exacerbated by movement. No exacerbating or relieving factors. They regress spontaneously but then he has left with some after effects of feeling slightly mentally foggy for several hours. Over the past week or 2 he has had a few isolated episodes of feeling short of breath while talking to people during these episodes in the afternoon. Over the past several days he has noticed some chest congestion which he describes as a bilateral upper chest tightness. Gets better when he makes himself cough. Started Mucinex and cough is now slightly productive.   He began working out at the gym every morning prior to this beginning and never has an episode during exercise. Exercise tolerance has been significantly improving even in the last few days distant history of tobacco abuse 25 years. Stopped in 2009. No history of asthma or COPD. Has never needed inhaler.  Does get recurrent allergic rhinitis.  He often uses Afrin nasal spray and does have problems with rebound congestion. He stopped the Afrin 2 days ago and currently has significant congestion. He can tell the soft tissue in his nares are swollen as if he pushes and pulls and around his nasal bridge using get some relief.  Chronic history of tinnitus. Unchanged. Does have a family history of Mnire's disease. Denies any  hearing loss.    at office visit with PCP in one month prior he was started on HCTZ. This medication was began after his current symptoms started and has not seem to exacerbate them. He was also tried on a beta blocker which caused severe fatigue and amlodipine which caused edema.  No orthostatic symptoms. No vertigo. No presyncope.  Did have one episode of gastritis/epigastric burning pain last night.  Has noted occasional left upper back scapular pain that radiates down his left arm. Has attributed this to muscular strain from resuming weightlifting.  He did undergo a cardiac stress test 6-7 years ago that had to be chemical as his heart rate was limited on a beta blocker. Reports it was normal. He reports his PCP did an EKG 1 month prior that was normal.  Has appt w/ PCP for CPE within a mo.  Past Medical History:  Diagnosis Date  . Diverticulosis   . GERD (gastroesophageal reflux disease)    Past Surgical History:  Procedure Laterality Date  . HERNIA REPAIR     bilateral inguinal   Current Outpatient Prescriptions on File Prior to Visit  Medication Sig Dispense Refill  . aspirin 325 MG tablet Take 650 mg by mouth daily.    Marland Kitchen. BENICAR 40 MG tablet TAKE 1 TABLET EVERY DAY  1  . amLODipine (NORVASC) 5 MG tablet Take 5 mg by mouth daily.     Marland Kitchen. dicyclomine (BENTYL) 20 MG tablet Take 1 tablet (20 mg total) by  mouth 3 (three) times daily before meals. (Patient not taking: Reported on 06/03/2016) 90 tablet 0  . Probiotic Product (PROBIOTIC PO) Take by mouth.     No current facility-administered medications on file prior to visit.    Allergies  Allergen Reactions  . Zestoretic [Lisinopril-Hydrochlorothiazide] Anxiety    Racing heart   No family history on file. Social History   Social History  . Marital status: Single    Spouse name: N/A  . Number of children: N/A  . Years of education: N/A   Social History Main Topics  . Smoking status: Former Games developer  . Smokeless tobacco:  Not on file  . Alcohol use No  . Drug use: Unknown  . Sexual activity: Not on file   Other Topics Concern  . Not on file   Social History Narrative  . No narrative on file   Depression screen [facility] 2/9 06/03/2016 01/14/2016  Decreased Interest 0 0  Down, Depressed, Hopeless 0 0  PHQ - 2 Score 0 0    Review of Systems See hpi    Objective:   Physical Exam  Constitutional: He is oriented to person, place, and time. He appears well-developed and well-nourished. No distress.  HENT:  Head: Normocephalic and atraumatic.  Right Ear: External ear and ear canal normal. Tympanic membrane is injected and retracted.  Left Ear: External ear and ear canal normal. Tympanic membrane is injected and retracted.  Nose: Nose normal.  Mouth/Throat: Mucous membranes are normal. Posterior oropharyngeal erythema (+ clear post-nasal drip) present. No oropharyngeal exudate.  Eyes: Conjunctivae are normal. No scleral icterus.  Neck: Normal range of motion. Neck supple. No thyromegaly present.  Cardiovascular: Normal rate, regular rhythm, normal heart sounds and intact distal pulses.   Pulmonary/Chest: Effort normal and breath sounds normal. No respiratory distress.  Musculoskeletal: He exhibits no edema.  Lymphadenopathy:    He has no cervical adenopathy.  Neurological: He is alert and oriented to person, place, and time.  Skin: Skin is warm and dry. He is not diaphoretic. No erythema.  Psychiatric: He has a normal mood and affect. His behavior is normal.      BP (!) 163/95   Pulse 66   Temp 98 F (36.7 C) (Oral)   Resp 18   Ht 5' 9.69" (1.77 m)   Wt 255 lb 9.6 oz (115.9 kg)   SpO2 97%   BMI 37.01 kg/m   Dg Chest 2 View  Result Date: 06/03/2016 CLINICAL DATA:  Dizziness and hypertension EXAM: CHEST  2 VIEW COMPARISON:  None. FINDINGS: Lungs are clear. Heart size and pulmonary vascularity are normal. No adenopathy. No bone lesions. There is an azygos lobe on the right, an anatomic variant.  IMPRESSION: No edema or consolidation. Electronically Signed   By: Bretta Bang III M.D.   On: 06/03/2016 11:38    . Orthostatic VS for the past 24 hrs:  BP- Lying Pulse- Lying BP- Sitting Pulse- Sitting BP- Standing at 0 minutes Pulse- Standing at 0 minutes  06/03/16 1222 130/81 64 132/85 71 136/84 70    EKG: NSR, flipped T waves in aVF and new q waves lead III compared to 10/09/14.   Results for orders placed or performed in visit on 06/03/16  POCT CBC  Result Value Ref Range   WBC 6.9 4.6 - 10.2 K/uL   Lymph, poc 1.7 0.6 - 3.4   POC LYMPH PERCENT 24.1 10 - 50 %L   MID (cbc) 0.2 0 - 0.9   POC  MID % 2.7 0 - 12 %M   POC Granulocyte 5.1 2 - 6.9   Granulocyte percent 73.2 37 - 80 %G   RBC 4.72 4.69 - 6.13 M/uL   Hemoglobin 15.6 14.1 - 18.1 g/dL   HCT, POC 16.1 09.6 - 53.7 %   MCV 94.8 80 - 97 fL   MCH, POC 33.0 (A) 27 - 31.2 pg   MCHC 34.7 31.8 - 35.4 g/dL   RDW, POC 04.5 %   Platelet Count, POC 217 142 - 424 K/uL   MPV 7.1 0 - 99.8 fL  POCT urinalysis dipstick  Result Value Ref Range   Color, UA yellow yellow   Clarity, UA clear clear   Glucose, UA negative negative mg/dL   Bilirubin, UA negative negative   Ketones, POC UA negative negative mg/dL   Spec Grav, UA 4.098 1.191 - 1.025   Blood, UA trace-intact (A) negative   pH, UA 5.5 5.0 - 8.0   Protein Ur, POC negative negative mg/dL   Urobilinogen, UA 0.2 0.2 or 1.0 E.U./dL   Nitrite, UA Negative Negative   Leukocytes, UA Negative Negative   Peak flow 420, post-alb neb peak flow 545; goal predicted peak flow. Assessment & Plan:    1. Dizziness  - very atypical; suspect due to Eustachian Tube dysfunction. Started nasacort 6-7d ago - continue this Start cetirizine qhs. Pt got good response to albuterol neb - he did have some mild sxs at the time of the OV which pt reports did improve some after neb.  Will have him start sched albuterol and see if we can circumvent the afternoon dizzy sxs. Start atrovent nasal  spray for the next few days to help with the rebound congestion from afrin. If no improvement, consider pred dose pack. Also poss of Meniere's with long-standing tinnitus, new dizziness, and FHx so will refer to ENT for eval of this and ETD if sxs cont.   2. Chest tightness - suspect due to mild RADCOPD with h/o tob abuse but there is a possibility of atypical anginal sxs as pt does have some cardiac risk factors - refer to cardiology for POET as last night pt did have some possibly anginal asstd sxs of epigastric pain and left upper back pain radiating down left arm so will do a stat trop r/o as well.   3. Nasal congestion   4. History of tobacco use   5. Mild intermittent reactive airway disease with acute exacerbation     Orders Placed This Encounter  Procedures  . DG Chest 2 View    Standing Status:   Future    Number of Occurrences:   1    Standing Expiration Date:   06/03/2017    Order Specific Question:   Reason for Exam (SYMPTOM  OR DIAGNOSIS REQUIRED)    Answer:   Dizziness. History of HTN.    Order Specific Question:   Preferred imaging location?    Answer:   External  . Basic metabolic panel    Order Specific Question:   Has the patient fasted?    Answer:   No  . TSH  . Troponin I    Call back result to Deliah Boston PA-C at [phone]  . Ambulatory referral to Cardiology    Referral Priority:   Routine    Referral Type:   Consultation    Referral Reason:   Specialty Services Required    Requested Specialty:   Cardiology    Number of Visits Requested:  1  . Ambulatory referral to ENT    Referral Priority:   Routine    Referral Type:   Consultation    Referral Reason:   Specialty Services Required    Requested Specialty:   Otolaryngology    Number of Visits Requested:   1  . Orthostatic vital signs  . Care order/instruction:    Scheduling Instructions:     Peak Flow (IF NEB IS ORDERED PLEASE DO BEFORE AND AFTER NEB)  . POCT CBC  . POCT urinalysis dipstick  .  EKG 12-Lead    Meds ordered this encounter  Medications  . hydrochlorothiazide (HYDRODIURIL) 12.5 MG tablet    Sig: Take 12.5 mg by mouth daily.  Marland Kitchen DISCONTD: albuterol (PROVENTIL) (2.5 MG/3ML) 0.083% nebulizer solution 2.5 mg  . albuterol (PROVENTIL) (2.5 MG/3ML) 0.083% nebulizer solution 2.5 mg  . ipratropium (ATROVENT) nebulizer solution 0.5 mg  . albuterol (PROVENTIL HFA;VENTOLIN HFA) 108 (90 Base) MCG/ACT inhaler    Sig: Inhale 2 puffs into the lungs every 4 (four) hours as needed for wheezing or shortness of breath (cough, shortness of breath or wheezing.).    Dispense:  1 Inhaler    Refill:  1  . ipratropium (ATROVENT) 0.03 % nasal spray    Sig: Place 2 sprays into the nose 4 (four) times daily.    Dispense:  30 mL    Refill:  1  . cetirizine (ZYRTEC) 10 MG tablet    Sig: Take 1 tablet (10 mg total) by mouth at bedtime.    Dispense:  30 tablet    Refill:  11  . triamcinolone (NASACORT) 55 MCG/ACT AERO nasal inhaler    Sig: Place 2 sprays into the nose daily.    Dispense:  1 Inhaler    Refill:  12    Over 40 min spent in face-to-face evaluation of and consultation with patient and coordination of care.  Over 50% of this time was spent counseling this patient.  Norberto Sorenson, M.D.  Primary Care at [facility] [address] [place], [place] [address] [phone] phone [phone] fax  06/03/16 1:41 PM   Fax note to [facility]

## 2016-06-03 NOTE — Patient Instructions (Addendum)
IF you received an x-ray today, you will receive an invoice from [facility]. Please contact [facility] at [phone] with questions or concerns regarding your invoice.   IF you received labwork today, you will receive an invoice from [place]. Please contact [facility] at [phone] with questions or concerns regarding your invoice.   Our billing staff will not be able to assist you with questions regarding bills from these companies.  You will be contacted with the lab results as soon as they are available. The fastest way to get your results is to activate your My Chart account. Instructions are located on the last page of this paperwork. If you have not heard from [place] regarding the results in 2 weeks, please contact this office.     Eustachian Tube Dysfunction The eustachian tube connects the middle ear to the back of the nose. It regulates air pressure in the middle ear by allowing air to move between the ear and nose. It also helps to drain fluid from the middle ear space. When the eustachian tube does not function properly, air pressure, fluid, or both can build up in the middle ear. Eustachian tube dysfunction can affect one or both ears. What are the causes? This condition happens when the eustachian tube becomes blocked or cannot open normally. This may result from:  Ear infections.  Colds and other upper respiratory infections.  Allergies.  Irritation, such as from cigarette smoke or acid from the stomach coming up into the esophagus (gastroesophageal reflux).  Sudden changes in air pressure, such as from descending in an airplane.  Abnormal growths in the nose or throat, such as nasal polyps, tumors, or enlarged tissue at the back of the throat (adenoids). What increases the risk? This condition may be more likely to develop in people who smoke and people who are overweight. Eustachian tube dysfunction may also be more likely to develop in children,  especially children who have:  Certain birth defects of the mouth, such as cleft palate.  Large tonsils and adenoids. What are the signs or symptoms? Symptoms of this condition may include:  A feeling of fullness in the ear.  Ear pain.  Clicking or popping noises in the ear.  Ringing in the ear.  Hearing loss.  Loss of balance. Symptoms may get worse when the air pressure around you changes, such as when you travel to an area of high elevation or fly on an airplane. How is this diagnosed? This condition may be diagnosed based on:  Your symptoms.  A physical exam of your ear, nose, and throat.  Tests, such as those that measure:  The movement of your eardrum (tympanogram).  Your hearing (audiometry). How is this treated? Treatment depends on the cause and severity of your condition. If your symptoms are mild, you may be able to relieve your symptoms by moving air into ("popping") your ears. If you have symptoms of fluid in your ears, treatment may include:  Decongestants.  Antihistamines.  Nasal sprays or ear drops that contain medicines that reduce swelling (steroids). In some cases, you may need to have a procedure to drain the fluid in your eardrum (myringotomy). In this procedure, a small tube is placed in the eardrum to:  Drain the fluid.  Restore the air in the middle ear space. Follow these instructions at home:  Take over-the-counter and prescription medicines only as told by your health care provider.  Use techniques to help pop your ears as recommended by your  health care provider. These may include:  Chewing gum.  Yawning.  Frequent, forceful swallowing.  Closing your mouth, holding your nose closed, and gently blowing as if you are trying to blow air out of your nose.  Do not do any of the following until your health care provider approves:  Travel to high altitudes.  Fly in airplanes.  Work in a Estate agentpressurized cabin or room.  Scuba  dive.  Keep your ears dry. Dry your ears completely after showering or bathing.  Do not smoke.  Keep all follow-up visits as told by your health care provider. This is important. Contact a health care provider if:  Your symptoms do not go away after treatment.  Your symptoms come back after treatment.  You are unable to pop your ears.  You have:  A fever.  Pain in your ear.  Pain in your head or neck.  Fluid draining from your ear.  Your hearing suddenly changes.  You become very dizzy.  You lose your balance. This information is not intended to replace advice given to you by your health care provider. Make sure you discuss any questions you have with your health care provider. Document Released: 02/06/2015 Document Revised: 06/18/2015 Document Reviewed: 01/29/2014 Elsevier Interactive Patient Education  2017 Elsevier Inc.   Asthma, Acute Bronchospasm Acute bronchospasm caused by asthma is also referred to as an asthma attack. Bronchospasm means your air passages become narrowed. The narrowing is caused by inflammation and tightening of the muscles in the air tubes (bronchi) in your lungs. This can make it hard to breathe or cause you to wheeze and cough. What are the causes? Possible triggers are:  Animal dander from the skin, hair, or feathers of animals.  Dust mites contained in house dust.  Cockroaches.  Pollen from trees or grass.  Mold.  Cigarette or tobacco smoke.  Air pollutants such as dust, household cleaners, hair sprays, aerosol sprays, paint fumes, strong chemicals, or strong odors.  Cold air or weather changes. Cold air may trigger inflammation. Winds increase molds and pollens in the air.  Strong emotions such as crying or laughing hard.  Stress.  Certain medicines such as aspirin or beta-blockers.  Sulfites in foods and drinks, such as dried fruits and wine.  Infections or inflammatory conditions, such as a flu, cold, or inflammation of  the nasal membranes (rhinitis).  Gastroesophageal reflux disease (GERD). GERD is a condition where stomach acid backs up into your esophagus.  Exercise or strenuous activity. What are the signs or symptoms?  Wheezing.  Excessive coughing, particularly at night.  Chest tightness.  Shortness of breath. How is this diagnosed? Your health care provider will ask you about your medical history and perform a physical exam. A chest X-ray or blood testing may be performed to look for other causes of your symptoms or other conditions that may have triggered your asthma attack. How is this treated? Treatment is aimed at reducing inflammation and opening up the airways in your lungs. Most asthma attacks are treated with inhaled medicines. These include quick relief or rescue medicines (such as bronchodilators) and controller medicines (such as inhaled corticosteroids). These medicines are sometimes given through an inhaler or a nebulizer. Systemic steroid medicine taken by mouth or given through an IV tube also can be used to reduce the inflammation when an attack is moderate or severe. Antibiotic medicines are only used if a bacterial infection is present. Follow these instructions at home:  Rest.  Drink plenty of liquids. This  helps the mucus to remain thin and be easily coughed up. Only use caffeine in moderation and do not use alcohol until you have recovered from your illness.  Do not smoke. Avoid being exposed to secondhand smoke.  You play a critical role in keeping yourself in good health. Avoid exposure to things that cause you to wheeze or to have breathing problems.  Keep your medicines up-to-date and available. Carefully follow your health care provider's treatment plan.  Take your medicine exactly as prescribed.  When pollen or pollution is bad, keep windows closed and use an air conditioner or go to places with air conditioning.  Asthma requires careful medical care. See your  health care provider for a follow-up as advised. If you are more than [redacted] weeks pregnant and you were prescribed any new medicines, let your obstetrician know about the visit and how you are doing. Follow up with your health care provider as directed.  After you have recovered from your asthma attack, make an appointment with your outpatient doctor to talk about ways to reduce the likelihood of future attacks. If you do not have a doctor who manages your asthma, make an appointment with a primary care doctor to discuss your asthma. Get help right away if:  You are getting worse.  You have trouble breathing. If severe, call your local emergency services (911 in the [place]  You develop chest pain or discomfort.  You are vomiting.  You are not able to keep fluids down.  You are coughing up yellow, green, brown, or bloody sputum.  You have a fever and your symptoms suddenly get worse.  You have trouble swallowing. This information is not intended to replace advice given to you by your health care provider. Make sure you discuss any questions you have with your health care provider. Document Released: 04/27/2006 Document Revised: 06/24/2015 Document Reviewed: 07/18/2012 Elsevier Interactive Patient Education  2017 [employer].

## 2016-06-04 LAB — TSH: TSH: 1.47 u[IU]/mL (ref 0.450–4.500)

## 2016-06-06 LAB — BASIC METABOLIC PANEL
BUN / CREAT RATIO: 13 (ref 9–20)
BUN: 12 mg/dL (ref 6–24)
CO2: 23 mmol/L (ref 18–29)
CREATININE: 0.9 mg/dL (ref 0.76–1.27)
Calcium: 9.6 mg/dL (ref 8.7–10.2)
Chloride: 104 mmol/L (ref 96–106)
GFR calc Af Amer: 116 mL/min/{1.73_m2} (ref >59)
GFR, EST NON AFRICAN AMERICAN: 100 mL/min/{1.73_m2} (ref >59)
GLUCOSE: 91 mg/dL (ref 65–99)
Potassium: 4.1 mmol/L (ref 3.5–5.2)
SODIUM: 143 mmol/L (ref 134–144)

## 2016-06-15 DIAGNOSIS — I10 Essential (primary) hypertension: Secondary | ICD-10-CM | POA: Diagnosis not present

## 2016-06-15 DIAGNOSIS — J342 Deviated nasal septum: Secondary | ICD-10-CM | POA: Diagnosis not present

## 2016-06-15 DIAGNOSIS — J329 Chronic sinusitis, unspecified: Secondary | ICD-10-CM | POA: Diagnosis not present

## 2016-06-15 DIAGNOSIS — Z0000 Encounter for general adult medical examination without abnormal findings: Secondary | ICD-10-CM | POA: Diagnosis not present

## 2016-06-15 DIAGNOSIS — K760 Fatty (change of) liver, not elsewhere classified: Secondary | ICD-10-CM | POA: Diagnosis not present

## 2016-06-23 ENCOUNTER — Encounter: Payer: Self-pay | Admitting: Emergency Medicine

## 2016-06-23 ENCOUNTER — Ambulatory Visit (INDEPENDENT_AMBULATORY_CARE_PROVIDER_SITE_OTHER): Payer: 59

## 2016-06-23 ENCOUNTER — Ambulatory Visit (INDEPENDENT_AMBULATORY_CARE_PROVIDER_SITE_OTHER): Payer: 59 | Admitting: Emergency Medicine

## 2016-06-23 VITALS — BP 145/86 | HR 74 | Temp 98.2°F | Resp 18 | Ht 69.69 in | Wt 255.6 lb

## 2016-06-23 DIAGNOSIS — M25561 Pain in right knee: Secondary | ICD-10-CM

## 2016-06-23 DIAGNOSIS — S8391XA Sprain of unspecified site of right knee, initial encounter: Secondary | ICD-10-CM

## 2016-06-23 DIAGNOSIS — S8991XA Unspecified injury of right lower leg, initial encounter: Secondary | ICD-10-CM | POA: Diagnosis not present

## 2016-06-23 MED ORDER — DICLOFENAC SODIUM 75 MG PO TBEC: 75.0000 mg | DELAYED_RELEASE_TABLET | Freq: Two times a day (BID) | ORAL | 0 refills | Status: AC

## 2016-06-23 NOTE — Progress Notes (Signed)
Michael DoveJames Peters 51 y.o.   Chief Complaint  Patient presents with  . Knee Injury    hit right knee x2weeks but both knees have been bothering him on and off   . lab results    wants to discuss something     HISTORY OF PRESENT ILLNESS: This is a 51 y.o. male complaining of pain to right knee.  HPI   Prior to Admission medications   Medication Sig Start Date End Date Taking? Authorizing Provider  albuterol (PROVENTIL HFA;VENTOLIN HFA) 108 (90 Base) MCG/ACT inhaler Inhale 2 puffs into the lungs every 4 (four) hours as needed for wheezing or shortness of breath (cough, shortness of breath or wheezing.). 06/03/16  Yes Sherren MochaShaw, Eva N, MD  aspirin 325 MG tablet Take 650 mg by mouth daily.   Yes [provider]  BENICAR 40 MG tablet TAKE 1 TABLET EVERY DAY 09/18/14  Yes [provider]  hydrochlorothiazide (HYDRODIURIL) 12.5 MG tablet Take 12.5 mg by mouth daily.   Yes [provider]  ipratropium (ATROVENT) 0.03 % nasal spray Place 2 sprays into the nose 4 (four) times daily. 06/03/16  Yes Sherren MochaShaw, Eva N, MD  Probiotic Product (PROBIOTIC PO) Take by mouth.   Yes [provider]  amLODipine (NORVASC) 5 MG tablet Take 5 mg by mouth daily.  10/07/14   [provider]  cetirizine (ZYRTEC) 10 MG tablet Take 1 tablet (10 mg total) by mouth at bedtime. Patient not taking: Reported on 06/23/2016 06/03/16   Sherren MochaShaw, Eva N, MD  triamcinolone (NASACORT) 55 MCG/ACT AERO nasal inhaler Place 2 sprays into the nose daily. Patient not taking: Reported on 06/23/2016 06/03/16   Sherren MochaShaw, Eva N, MD    Allergies  Allergen Reactions  . Zestoretic [Lisinopril-Hydrochlorothiazide] Anxiety    Racing heart    There are no active problems to display for this patient.   Past Medical History:  Diagnosis Date  . Diverticulosis   . GERD (gastroesophageal reflux disease)     Past Surgical History:  Procedure Laterality Date  . HERNIA REPAIR     bilateral inguinal    Social History     Social History  . Marital status: Single    Spouse name: N/A  . Number of children: N/A  . Years of education: N/A   Occupational History  . Not on file.   Social History Main Topics  . Smoking status: Former Games developermoker  . Smokeless tobacco: Never Used  . Alcohol use No  . Drug use: Unknown  . Sexual activity: Not on file   Other Topics Concern  . Not on file   Social History Narrative  . No narrative on file    No family history on file.   Review of Systems  Constitutional: Negative.  Negative for chills and fever.  HENT: Negative.   Eyes: Negative.   Respiratory: Negative.  Negative for cough.   Cardiovascular: Negative.  Negative for chest pain and palpitations.  Gastrointestinal: Negative.  Negative for abdominal pain, diarrhea, nausea and vomiting.  Musculoskeletal: Positive for joint pain (knee pains).  Skin: Negative.  Negative for rash.  Neurological: Negative.  Negative for dizziness, sensory change, focal weakness and headaches.  Endo/Heme/Allergies: Negative.   All other systems reviewed and are negative.  Vitals:   06/23/16 1354  BP: (!) 145/86  Pulse: 74  Resp: 18  Temp: 98.2 F (36.8 C)   Dg Knee Complete 4 Views Right  Result Date: 06/23/2016 CLINICAL DATA:  Right knee each pain following  an injury 3 weeks ago EXAM: RIGHT KNEE - COMPLETE 4+ VIEW COMPARISON:  None. FINDINGS: Mild medial joint space narrowing. Otherwise, normal appearing bones and soft tissues. No fracture, dislocation or effusion seen. IMPRESSION: Mild medial joint space narrowing.  Otherwise, normal examination. Electronically Signed   By: Beckie Salts M.D.   On: 06/23/2016 14:50    Physical Exam  Constitutional: He is oriented to person, place, and time. He appears well-developed and well-nourished.  HENT:  Head: Normocephalic and atraumatic.  Eyes: Conjunctivae and EOM are normal. Pupils are equal, round, and reactive to light.  Neck: Normal range of motion. Neck supple.   Cardiovascular: Normal rate, regular rhythm and normal heart sounds.   Pulmonary/Chest: Effort normal and breath sounds normal.  Musculoskeletal:  Right knee: stable, no erythema or bruising, no swelling, and no significant tenderness. Rest of extremities:WNL  Neurological: He is alert and oriented to person, place, and time. No sensory deficit. He exhibits normal muscle tone.  Skin: Skin is warm and dry. Capillary refill takes less than 2 seconds. No rash noted.  Psychiatric: He has a normal mood and affect. His behavior is normal.  Vitals reviewed.    ASSESSMENT & PLAN: Michael Peters was seen today for knee injury and lab results.  Diagnoses and all orders for this visit:  Acute pain of right knee -     DG Knee Complete 4 Views Right; Future  Sprain of right knee, unspecified ligament, initial encounter  Other orders -     diclofenac (VOLTAREN) 75 MG EC tablet; Take 1 tablet (75 mg total) by mouth 2 (two) times daily.    Patient Instructions       IF you received an x-ray today, you will receive an invoice from [facility]. Please contact [facility] at [phone] with questions or concerns regarding your invoice.   IF you received labwork today, you will receive an invoice from [place]. Please contact [facility] at [phone] with questions or concerns regarding your invoice.   Our billing staff will not be able to assist you with questions regarding bills from these companies.  You will be contacted with the lab results as soon as they are available. The fastest way to get your results is to activate your My Chart account. Instructions are located on the last page of this paperwork. If you have not heard from [place] regarding the results in 2 weeks, please contact this office.      Knee Pain, Adult Many things can cause knee pain. The pain often goes away on its own with time and rest. If the pain does not go away, tests may be done to find out what is  causing the pain. Follow these instructions at home: Activity  Rest your knee.  Do not do things that cause pain.  Avoid activities where both feet leave the ground at the same time (high-impact activities). Examples are running, jumping rope, and doing jumping jacks. General instructions  Take medicines only as told by your doctor.  Raise (elevate) your knee when you are resting. Make sure your knee is higher than your heart.  Sleep with a pillow under your knee.  If told, put ice on the knee: ? Put ice in a plastic bag. ? Place a towel between your skin and the bag. ? Leave the ice on for 20 minutes, 2-3 times a day.  Ask your doctor if you should wear an elastic knee support.  Lose weight if you are overweight. Being overweight can  make your knee hurt more.  Do not use any tobacco products. These include cigarettes, chewing tobacco, or electronic cigarettes. If you need help quitting, ask your doctor. Smoking may slow down healing. Contact a doctor if:  The pain does not stop.  The pain changes or gets worse.  You have a fever along with knee pain.  Your knee gives out or locks up.  Your knee swells, and becomes worse. Get help right away if:  Your knee feels warm.  You cannot move your knee.  You have very bad knee pain.  You have chest pain.  You have trouble breathing. Summary  Many things can cause knee pain. The pain often goes away on its own with time and rest.  Avoid activities that put stress on your knee. These include running and jumping rope.  Get help right away if you cannot move your knee, or if your knee feels warm, or if you have trouble breathing. This information is not intended to replace advice given to you by your health care provider. Make sure you discuss any questions you have with your health care provider. Document Released: 04/08/2008 Document Revised: 01/05/2016 Document Reviewed: 01/05/2016 Elsevier Interactive Patient  Education  2017 Elsevier Inc.      Edwina Barth, MD [facility]

## 2016-06-23 NOTE — Patient Instructions (Addendum)
     IF you received an x-ray today, you will receive an invoice from [facility]. Please contact [facility] at [phone] with questions or concerns regarding your invoice.   IF you received labwork today, you will receive an invoice from [facility]. Please contact [facility] at [phone] with questions or concerns regarding your invoice.   Our billing staff will not be able to assist you with questions regarding bills from these companies.  You will be contacted with the lab results as soon as they are available. The fastest way to get your results is to activate your My Chart account. Instructions are located on the last page of this paperwork. If you have not heard from us regarding the results in 2 weeks, please contact this office.      Knee Pain, Adult Many things can cause knee pain. The pain often goes away on its own with time and rest. If the pain does not go away, tests may be done to find out what is causing the pain. Follow these instructions at home: Activity  Rest your knee.  Do not do things that cause pain.  Avoid activities where both feet leave the ground at the same time (high-impact activities). Examples are running, jumping rope, and doing jumping jacks. General instructions  Take medicines only as told by your doctor.  Raise (elevate) your knee when you are resting. Make sure your knee is higher than your heart.  Sleep with a pillow under your knee.  If told, put ice on the knee: ? Put ice in a plastic bag. ? Place a towel between your skin and the bag. ? Leave the ice on for 20 minutes, 2-3 times a day.  Ask your doctor if you should wear an elastic knee support.  Lose weight if you are overweight. Being overweight can make your knee hurt more.  Do not use any tobacco products. These include cigarettes, chewing tobacco, or electronic cigarettes. If you need help quitting, ask your doctor. Smoking may slow down healing. Contact a  doctor if:  The pain does not stop.  The pain changes or gets worse.  You have a fever along with knee pain.  Your knee gives out or locks up.  Your knee swells, and becomes worse. Get help right away if:  Your knee feels warm.  You cannot move your knee.  You have very bad knee pain.  You have chest pain.  You have trouble breathing. Summary  Many things can cause knee pain. The pain often goes away on its own with time and rest.  Avoid activities that put stress on your knee. These include running and jumping rope.  Get help right away if you cannot move your knee, or if your knee feels warm, or if you have trouble breathing. This information is not intended to replace advice given to you by your health care provider. Make sure you discuss any questions you have with your health care provider. Document Released: 04/08/2008 Document Revised: 01/05/2016 Document Reviewed: 01/05/2016 Elsevier Interactive Patient Education  2017 Elsevier Inc.  

## 2016-06-29 DIAGNOSIS — Z0000 Encounter for general adult medical examination without abnormal findings: Secondary | ICD-10-CM | POA: Diagnosis not present

## 2016-06-29 DIAGNOSIS — I10 Essential (primary) hypertension: Secondary | ICD-10-CM | POA: Diagnosis not present

## 2016-06-30 DIAGNOSIS — R05 Cough: Secondary | ICD-10-CM | POA: Diagnosis not present

## 2016-06-30 DIAGNOSIS — Z0189 Encounter for other specified special examinations: Secondary | ICD-10-CM | POA: Diagnosis not present

## 2016-06-30 DIAGNOSIS — I10 Essential (primary) hypertension: Secondary | ICD-10-CM | POA: Diagnosis not present

## 2016-07-08 ENCOUNTER — Encounter: Payer: Self-pay | Admitting: Physician Assistant

## 2016-07-08 ENCOUNTER — Ambulatory Visit (INDEPENDENT_AMBULATORY_CARE_PROVIDER_SITE_OTHER): Payer: 59 | Admitting: Physician Assistant

## 2016-07-08 VITALS — BP 138/80 | HR 85 | Temp 98.0°F | Resp 18 | Ht 68.5 in | Wt 288.0 lb

## 2016-07-08 DIAGNOSIS — I10 Essential (primary) hypertension: Secondary | ICD-10-CM

## 2016-07-08 DIAGNOSIS — R21 Rash and other nonspecific skin eruption: Secondary | ICD-10-CM

## 2016-07-08 MED ORDER — PREDNISONE 10 MG PO TABS: ORAL_TABLET | ORAL | 0 refills | Status: AC

## 2016-07-08 NOTE — Progress Notes (Signed)
Michael Peters  MRN: [id number] DOB: February 16, 1965  PCP: Michael Headings, MD  Chief Complaint  Patient presents with  . Urticaria    all over body x 2 days with itching .     Subjective:  Pt presents to clinic for rash over his body for the last 2 days.  Little red bumps and then areas that look like scratches.  No h/o environment allergies.  Did go to the storage unit and remove some boating stuff and it was really dusty.  No new foods or detergents or lotions or soaps.  No new medications.  He was started on diclofenac about 2 weeks ago but he stopped it 2 days ago.  He is not really itchy.  No sexual activity in years.  Review of Systems  HENT: Negative for trouble swallowing.   Respiratory: Negative for shortness of breath and wheezing.   Skin: Positive for rash.    Patient Active Problem List   Diagnosis Date Noted  . Acute pain of right knee 06/23/2016  . Sprain of right knee 06/23/2016    Current Outpatient Prescriptions on File Prior to Visit  Medication Sig Dispense Refill  . albuterol (PROVENTIL HFA;VENTOLIN HFA) 108 (90 Base) MCG/ACT inhaler Inhale 2 puffs into the lungs every 4 (four) hours as needed for wheezing or shortness of breath (cough, shortness of breath or wheezing.). 1 Inhaler 1  . aspirin 325 MG tablet Take 650 mg by mouth daily.    Marland Kitchen BENICAR 40 MG tablet TAKE 1 TABLET EVERY DAY  1  . cetirizine (ZYRTEC) 10 MG tablet Take 1 tablet (10 mg total) by mouth at bedtime. 30 tablet 11  . hydrochlorothiazide (HYDRODIURIL) 12.5 MG tablet Take 12.5 mg by mouth daily.    Marland Kitchen ipratropium (ATROVENT) 0.03 % nasal spray Place 2 sprays into the nose 4 (four) times daily. 30 mL 1  . Probiotic Product (PROBIOTIC PO) Take by mouth.    . triamcinolone (NASACORT) 55 MCG/ACT AERO nasal inhaler Place 2 sprays into the nose daily. 1 Inhaler 12   No current facility-administered medications on file prior to visit.     Allergies  Allergen Reactions  . Zestoretic  [Lisinopril-Hydrochlorothiazide] Anxiety    Racing heart    Pt patients past, family and social history were reviewed and updated.   Objective:  BP 138/80   Pulse 85   Temp 98 F (36.7 C) (Oral)   Resp 18   Ht 5' 8.5" (1.74 m)   Wt 288 lb (130.6 kg)   SpO2 98%   BMI 43.15 kg/m   Physical Exam  Constitutional: He is oriented to person, place, and time and well-developed, well-nourished, and in no distress.  HENT:  Head: Normocephalic and atraumatic.  Right Ear: External ear normal.  Left Ear: External ear normal.  Eyes: Conjunctivae are normal.  Neck: Normal range of motion.  Cardiovascular: Normal rate, regular rhythm and normal heart sounds.   No murmur heard. Pulmonary/Chest: Effort normal and breath sounds normal. He has no wheezes.  Neurological: He is alert and oriented to person, place, and time. Gait normal.  Skin: Skin is warm and dry. Rash (partially blanching macular rash with linear rasied areas.  No dermatographia.  small 1cm areas of vesoicles on the right inner ankle.) noted.  Psychiatric: Mood, memory, affect and judgment normal.    Assessment and Plan :  Rash - Plan: RPR, predniSONE (DELTASONE) 10 MG tablet, Care order/instruction:  Essential hypertension - Plan: Measure blood pressure - improved upon  recheck  Unsure of cause of rash - will start prednisone due to suspect internal reaction to something - ? Dust mold on boating equipment - check syphilis to make sure that is not the cause of the rash though unlikely.  Start prednisone.  ?Diclofenac though pt had stopped the medication by the time the rash started - he was warned that a delay sensitivity could have happened and if he ever uses it again he needs to be aware if he develops a similar type of rash.  F/u if problems.  Benny LennertSarah Cassandr Cederberg PA-C  [facility] at [facility] 07/08/2016 2:45 PM

## 2016-07-08 NOTE — Patient Instructions (Addendum)
  Zyrtec - use that daily Add benadryl at night  Take zantac 2/day   IF you received an x-ray today, you will receive an invoice from [facility]. Please contact [facility] at [phone] with questions or concerns regarding your invoice.   IF you received labwork today, you will receive an invoice from [facility]. Please contact [facility] at [phone] with questions or concerns regarding your invoice.   Our billing staff will not be able to assist you with questions regarding bills from these companies.  You will be contacted with the lab results as soon as they are available. The fastest way to get your results is to activate your My Chart account. Instructions are located on the last page of this paperwork. If you have not heard from us regarding the results in 2 weeks, please contact this office.

## 2016-07-09 LAB — RPR: RPR: NONREACTIVE

## 2016-07-19 DIAGNOSIS — H0289 Other specified disorders of eyelid: Secondary | ICD-10-CM | POA: Diagnosis not present

## 2016-12-22 DIAGNOSIS — I10 Essential (primary) hypertension: Secondary | ICD-10-CM | POA: Diagnosis not present

## 2016-12-22 DIAGNOSIS — E785 Hyperlipidemia, unspecified: Secondary | ICD-10-CM | POA: Diagnosis not present

## 2016-12-29 DIAGNOSIS — I10 Essential (primary) hypertension: Secondary | ICD-10-CM | POA: Diagnosis not present

## 2016-12-29 DIAGNOSIS — K760 Fatty (change of) liver, not elsewhere classified: Secondary | ICD-10-CM | POA: Diagnosis not present

## 2016-12-29 DIAGNOSIS — R221 Localized swelling, mass and lump, neck: Secondary | ICD-10-CM | POA: Diagnosis not present

## 2017-06-05 DIAGNOSIS — R001 Bradycardia, unspecified: Secondary | ICD-10-CM | POA: Diagnosis not present

## 2017-06-05 DIAGNOSIS — R0789 Other chest pain: Secondary | ICD-10-CM | POA: Diagnosis not present

## 2017-06-05 DIAGNOSIS — I10 Essential (primary) hypertension: Secondary | ICD-10-CM | POA: Diagnosis not present

## 2017-06-07 DIAGNOSIS — R001 Bradycardia, unspecified: Secondary | ICD-10-CM | POA: Diagnosis not present

## 2017-06-07 DIAGNOSIS — Z87891 Personal history of nicotine dependence: Secondary | ICD-10-CM | POA: Diagnosis not present

## 2017-06-07 DIAGNOSIS — I10 Essential (primary) hypertension: Secondary | ICD-10-CM | POA: Diagnosis not present

## 2017-06-09 DIAGNOSIS — R001 Bradycardia, unspecified: Secondary | ICD-10-CM | POA: Diagnosis not present

## 2017-06-26 DIAGNOSIS — E785 Hyperlipidemia, unspecified: Secondary | ICD-10-CM | POA: Diagnosis not present

## 2017-06-26 DIAGNOSIS — Z125 Encounter for screening for malignant neoplasm of prostate: Secondary | ICD-10-CM | POA: Diagnosis not present

## 2017-06-26 DIAGNOSIS — I10 Essential (primary) hypertension: Secondary | ICD-10-CM | POA: Diagnosis not present

## 2017-06-29 DIAGNOSIS — R600 Localized edema: Secondary | ICD-10-CM | POA: Diagnosis not present

## 2017-06-29 DIAGNOSIS — I10 Essential (primary) hypertension: Secondary | ICD-10-CM | POA: Diagnosis not present

## 2017-07-03 DIAGNOSIS — L659 Nonscarring hair loss, unspecified: Secondary | ICD-10-CM | POA: Diagnosis not present

## 2017-07-03 DIAGNOSIS — Z0000 Encounter for general adult medical examination without abnormal findings: Secondary | ICD-10-CM | POA: Diagnosis not present

## 2017-07-17 DIAGNOSIS — I10 Essential (primary) hypertension: Secondary | ICD-10-CM | POA: Diagnosis not present

## 2017-07-17 DIAGNOSIS — R001 Bradycardia, unspecified: Secondary | ICD-10-CM | POA: Diagnosis not present

## 2017-07-17 DIAGNOSIS — R600 Localized edema: Secondary | ICD-10-CM | POA: Diagnosis not present

## 2017-12-18 DIAGNOSIS — R399 Unspecified symptoms and signs involving the genitourinary system: Secondary | ICD-10-CM | POA: Diagnosis not present

## 2017-12-19 DIAGNOSIS — R3129 Other microscopic hematuria: Secondary | ICD-10-CM | POA: Diagnosis not present

## 2018-02-12 IMAGING — DX DG CHEST 2V
2 series · 2 of 2 positions shown · non-contrast
Comparison: None.

CLINICAL DATA: Dizziness and hypertension

EXAM:
CHEST  2 VIEW

[chest pa]
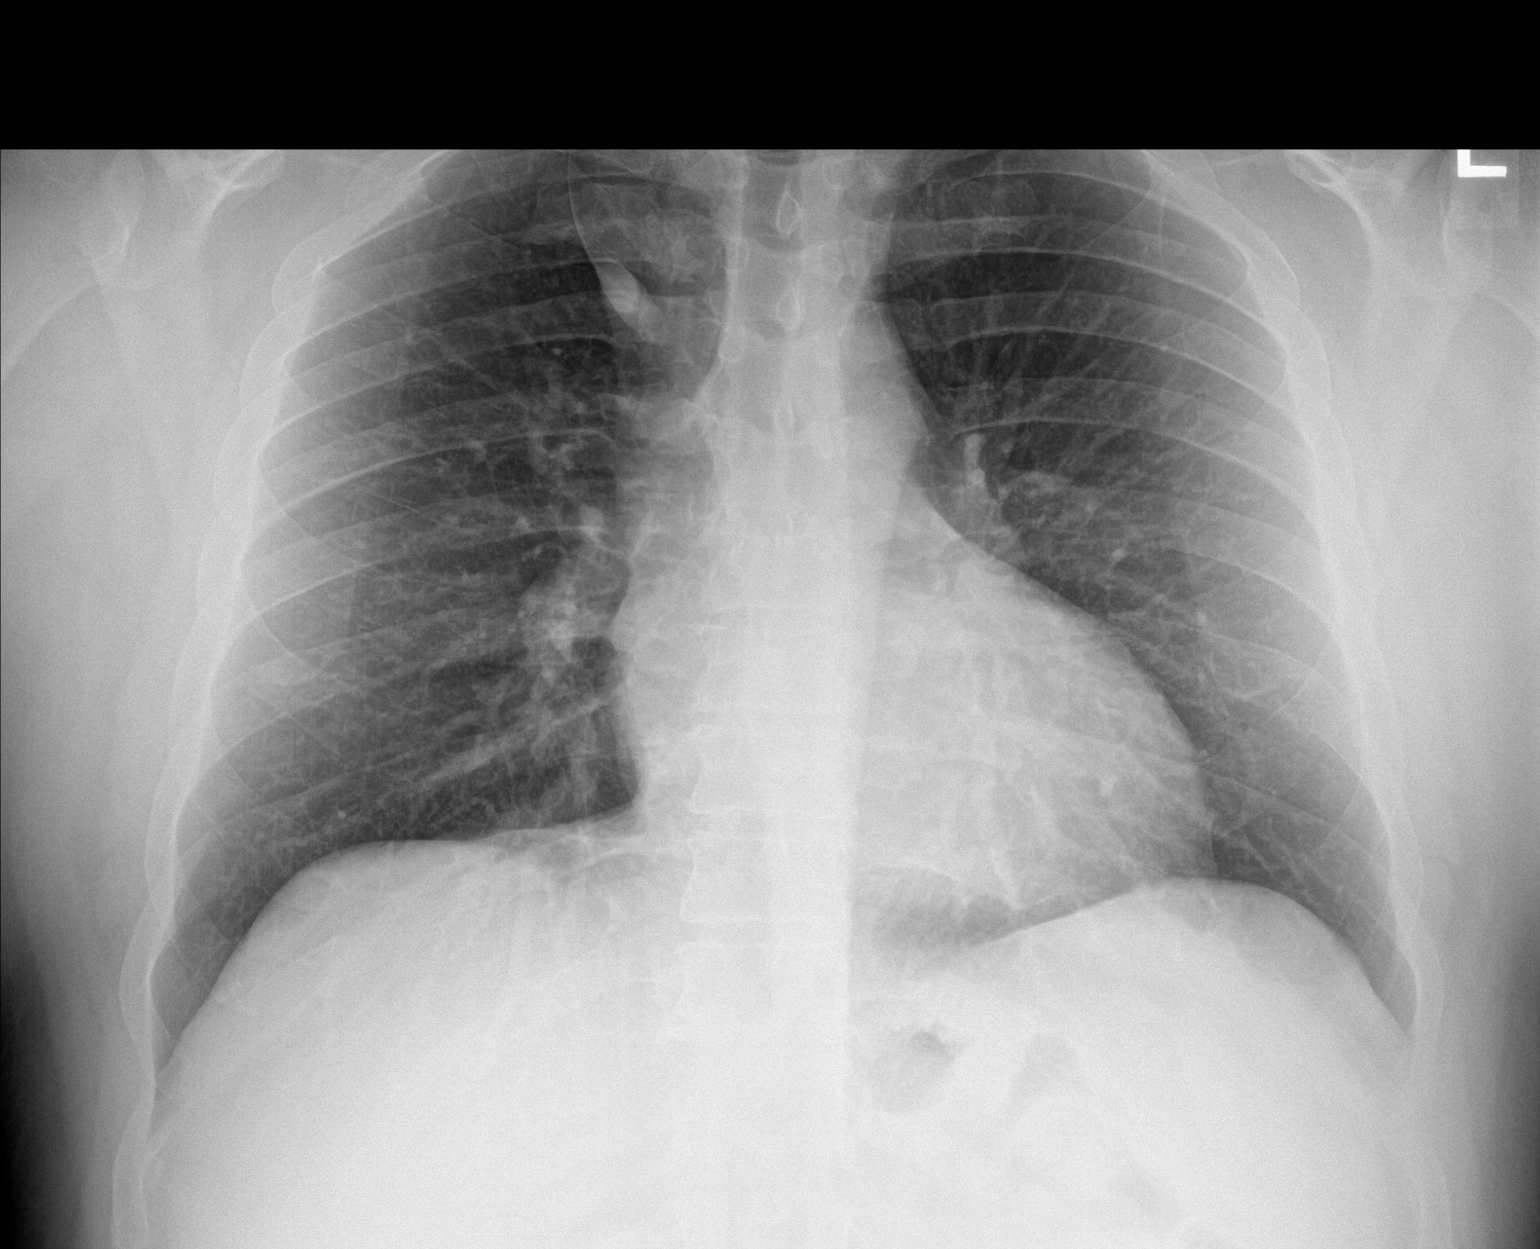

[chest lat]
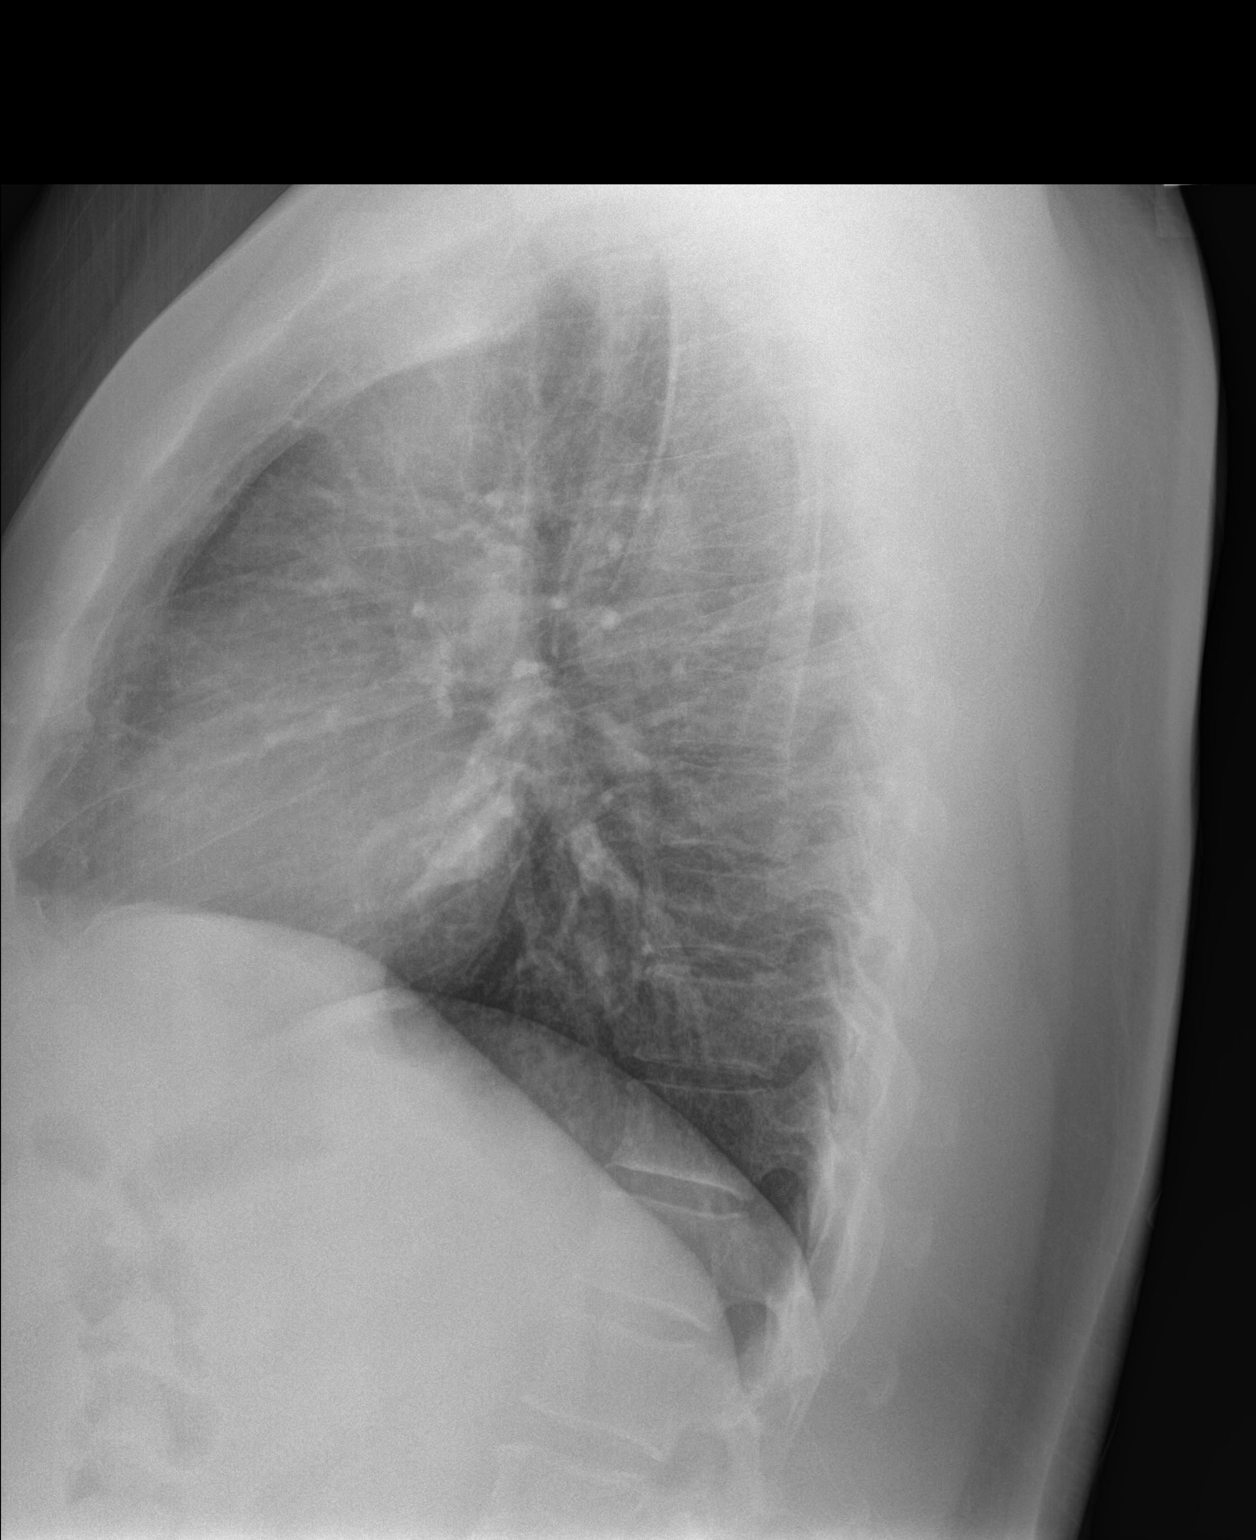

[2 of 2 positions shown; findings below may reference images not displayed]

FINDINGS: Lungs are clear. Heart size and pulmonary vascularity are normal. No
adenopathy. No bone lesions. There is an azygos lobe on the right,
an anatomic variant.
IMPRESSION: No edema or consolidation.

## 2018-03-04 IMAGING — DX DG KNEE COMPLETE 4+V*R*
4 series · 4 of 4 positions shown · non-contrast
Comparison: None.

CLINICAL DATA: Right knee each pain following an injury 3 weeks ago

EXAM:
RIGHT KNEE - COMPLETE 4+ VIEW

[knee ap]
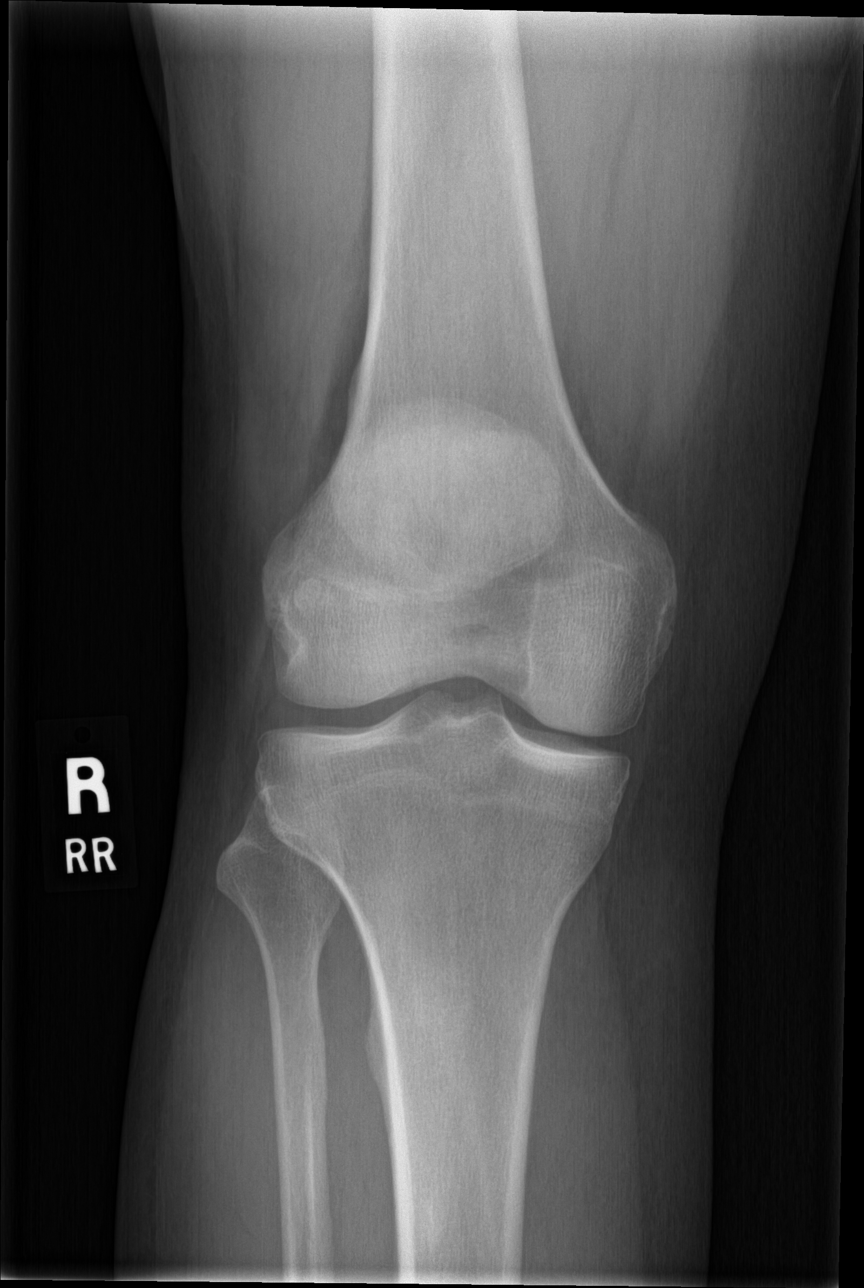

[knee obl (1 of 2)]
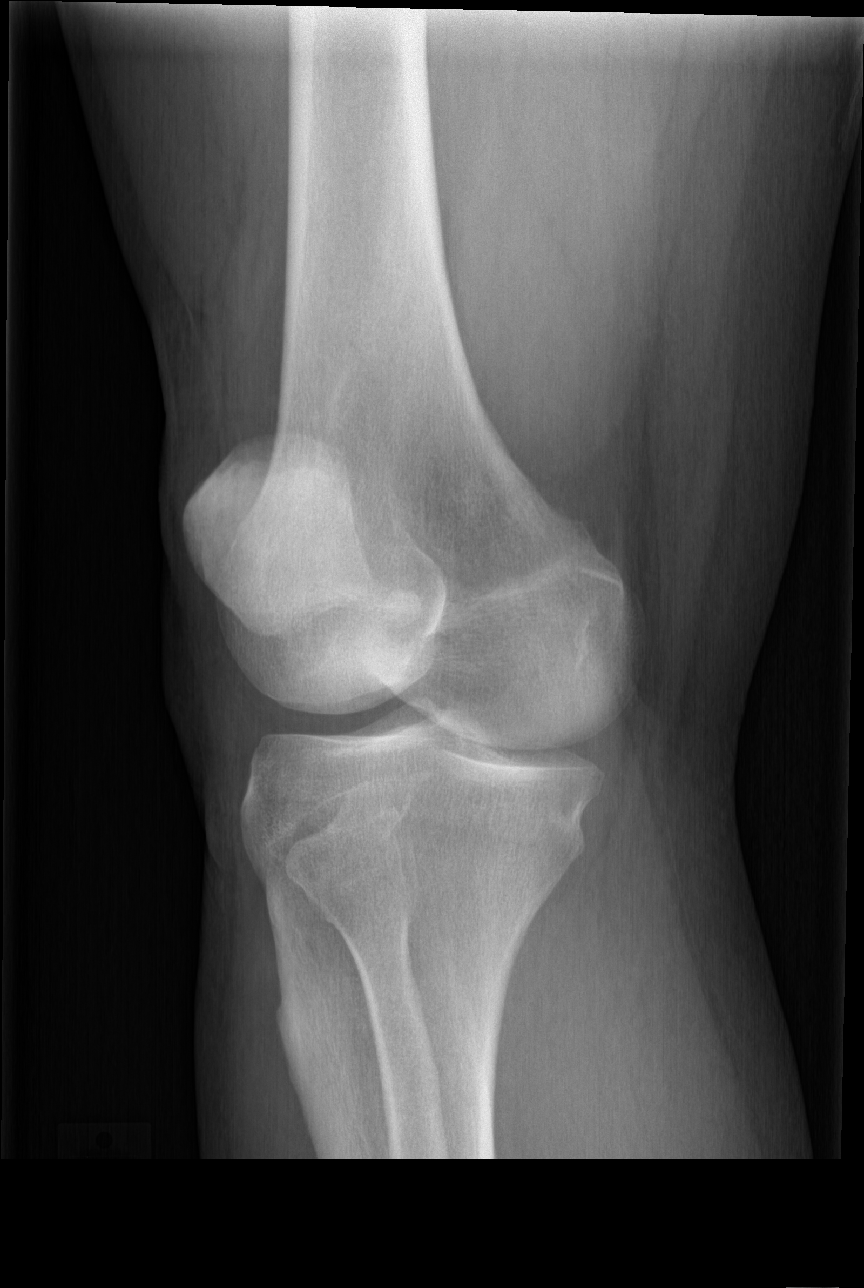

[knee obl (2 of 2)]
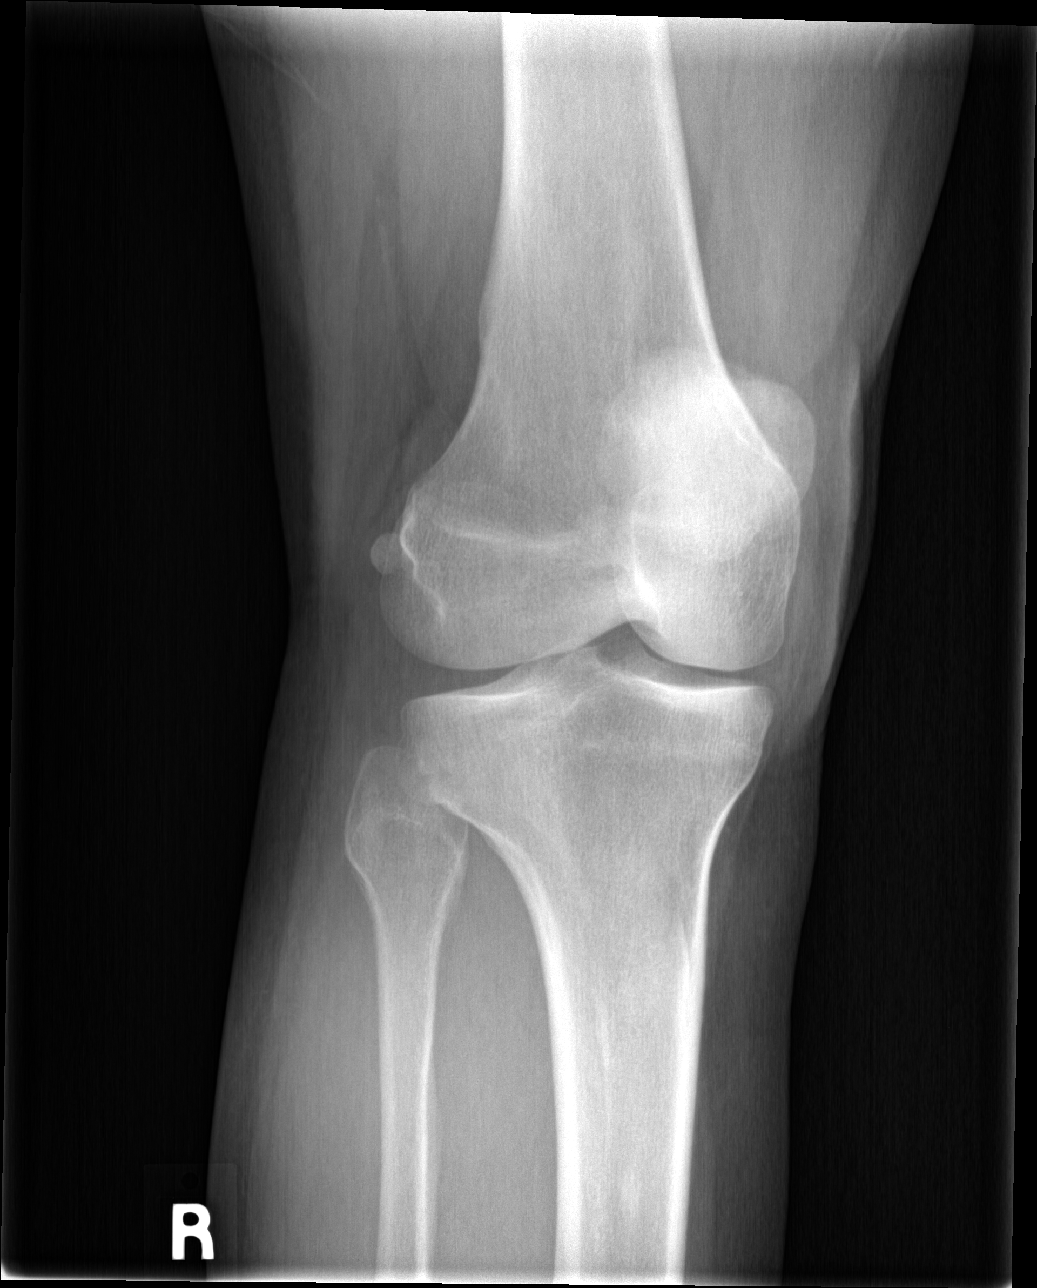

[knee lat]
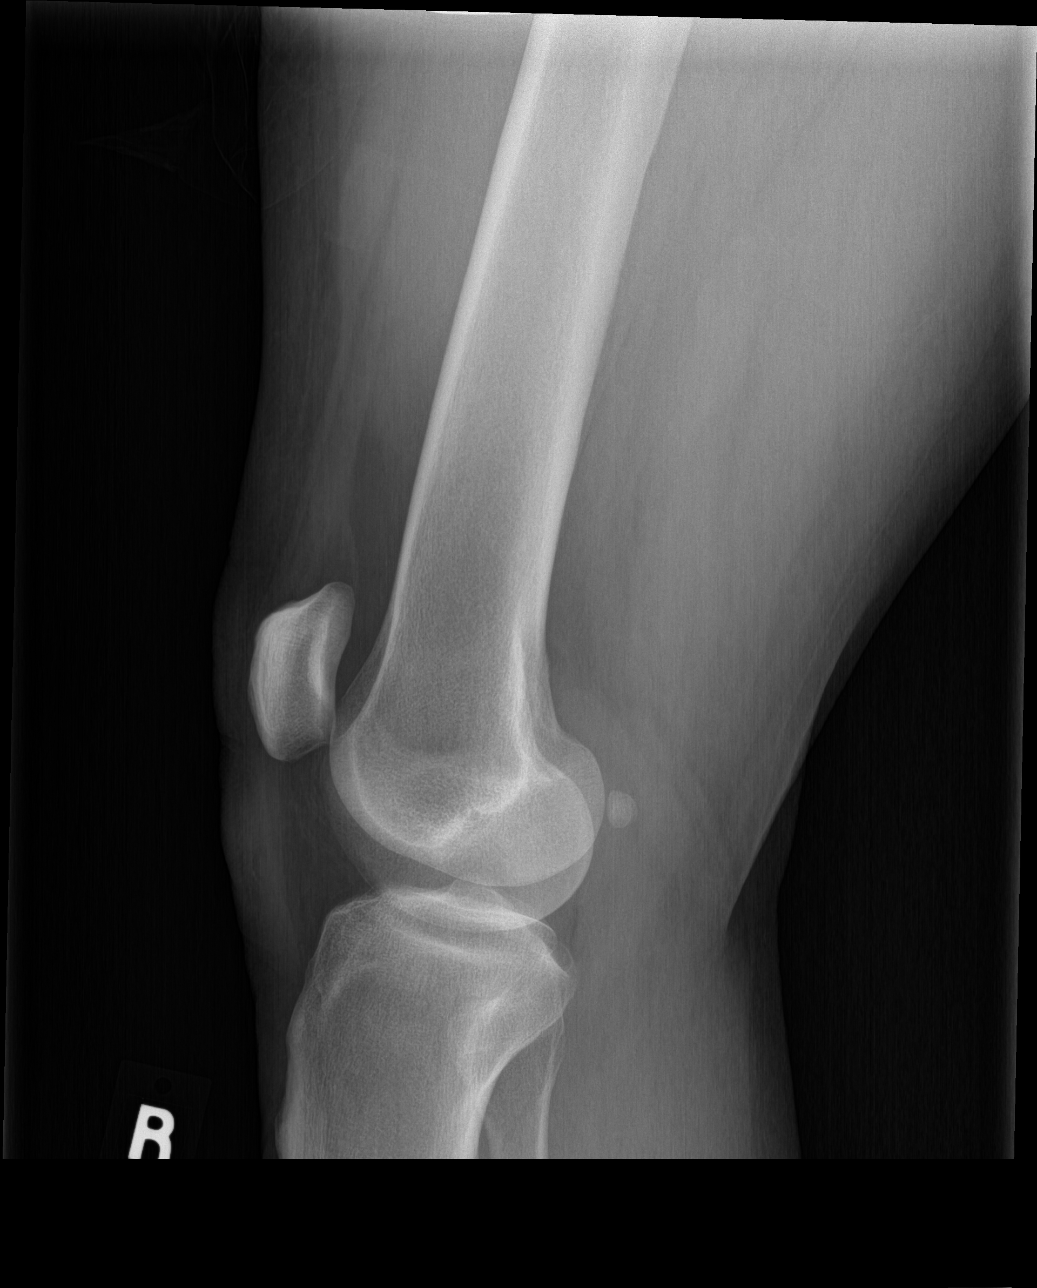

[4 of 4 positions shown; findings below may reference images not displayed]

FINDINGS: Mild medial joint space narrowing. Otherwise, normal appearing bones
and soft tissues. No fracture, dislocation or effusion seen.
IMPRESSION: Mild medial joint space narrowing.  Otherwise, normal examination.

## 2018-09-20 ENCOUNTER — Ambulatory Visit: Payer: 59 | Admitting: Family Medicine

## 2018-09-20 ENCOUNTER — Encounter: Payer: Self-pay | Admitting: Family Medicine

## 2018-09-20 ENCOUNTER — Other Ambulatory Visit: Payer: Self-pay

## 2018-09-20 ENCOUNTER — Ambulatory Visit (INDEPENDENT_AMBULATORY_CARE_PROVIDER_SITE_OTHER): Payer: 59

## 2018-09-20 VITALS — BP 150/87 | HR 71 | Temp 98.4°F | Resp 18 | Wt 217.2 lb

## 2018-09-20 DIAGNOSIS — M25561 Pain in right knee: Secondary | ICD-10-CM

## 2018-09-20 DIAGNOSIS — Z23 Encounter for immunization: Secondary | ICD-10-CM

## 2018-09-20 NOTE — Progress Notes (Signed)
Patient ID: Tiron Suski, male    DOB: [birth year]  Age: 53 y.o. MRN: [id number]  Chief Complaint  Patient presents with  . Knee Pain    R knee, hiking, starting 3.5 weeks ago, lidocain cream, OTC meds    Subjective:   About 6 weeks ago the patient went quite well and resting.  He was on his knees all day.  A couple of days later he started having pain in the right knee, just below the knee and laterally.  He felt a rubber band like popping to it at times.  It waxed and waned some.  He saw a Designer, jewellery in [place] who recommended time, NSAID gel, and lidocaine gel.  It helped a little bit but then symptoms keep recurring.  He sits at a desk all day.  Not been being very active since he hurt this.  He has put on weight during the [place].  He played sports when he was in school but no major knee injuries.  Current allergies, medications, problem list, past/family and social histories reviewed.  Objective:  BP (!) 150/87 (BP Location: Right Arm, Patient Position: Sitting, Cuff Size: Large)   Pulse 71   Temp 98.4 F (36.9 C) (Oral)   Resp 18   Wt 217 lb 3.2 oz (98.5 kg)   SpO2 97%   BMI 32.54 kg/m   No acute distress.  No swelling.  No major pain except a little pain exhibited when abducting the knee.  He is mildly tender at the edge of the tibial plateau.  Assessment & Plan:   Assessment: 1. Acute pain of right knee   2. Needs flu shot   3. Need for immunization against influenza       Plan: Possible mild iliotibial band syndrome or just tendon inflammation at the insertion.  Will get x-ray.  Orders Placed This Encounter  Procedures  . DG Knee Complete 4 Views Right    Standing Status:   Future    Number of Occurrences:   1    Standing Expiration Date:   11/20/2019    Order Specific Question:   Reason for Exam (SYMPTOM  OR DIAGNOSIS REQUIRED)    Answer:   pain 6 weeks lateral right knee tibial plateau area    Order Specific Question:   Preferred imaging  location?    Answer:   External    Order Specific Question:   Radiology Contrast Protocol - do NOT remove file path    Answer:   [email or web address]  . Flu Vaccine QUAD 36+ mos IM    No orders of the defined types were placed in this encounter.     Films reviewed and are normal   Patient Instructions  Wear your knee brace when you are being more active.  Take Aleve (naproxen) 220 mg 2 pills twice daily at breakfast and supper for pain and inflammation  If that anti-inflammatory effect does not give you relief over the next 10 days to 2 weeks then I recommend having you see a sports medicine physician.  The orthopedic group that you have been to in the past has a sports medicine doctor, Dr. Vickki Hearing, whom I highly recommend.  If you need to call here for referral, but they probably would take you in directly.  Return at anytime if worse.  I am attaching the instructions regarding and iliotibial band syndrome which this may be a mild version of.      No follow-ups on file.  Janace Hoardavid Hopper, MD 09/20/2018

## 2018-09-20 NOTE — Patient Instructions (Addendum)
Wear your knee brace when you are being more active.  Take Aleve (naproxen) 220 mg 2 pills twice daily at breakfast and supper for pain and inflammation  If that anti-inflammatory effect does not give you relief over the next 10 days to 2 weeks then I recommend having you see a sports medicine physician.  The orthopedic group that you have been to in the past has a sports medicine doctor, Dr. Vickki Hearing, whom I highly recommend.  If you need to call here for referral, but they probably would take you in directly.  Return at anytime if worse.  I am attaching the instructions regarding and iliotibial band syndrome which this may be a mild version of.

## 2018-12-17 ENCOUNTER — Encounter (HOSPITAL_COMMUNITY): Payer: Self-pay | Admitting: Emergency Medicine

## 2018-12-17 ENCOUNTER — Other Ambulatory Visit: Payer: Self-pay

## 2018-12-17 DIAGNOSIS — I10 Essential (primary) hypertension: Secondary | ICD-10-CM | POA: Insufficient documentation

## 2018-12-17 DIAGNOSIS — Z87891 Personal history of nicotine dependence: Secondary | ICD-10-CM | POA: Insufficient documentation

## 2018-12-17 DIAGNOSIS — Z7982 Long term (current) use of aspirin: Secondary | ICD-10-CM | POA: Insufficient documentation

## 2018-12-17 DIAGNOSIS — R6889 Other general symptoms and signs: Secondary | ICD-10-CM | POA: Diagnosis present

## 2018-12-17 DIAGNOSIS — Z79899 Other long term (current) drug therapy: Secondary | ICD-10-CM | POA: Diagnosis not present

## 2018-12-17 NOTE — ED Triage Notes (Signed)
Patient took his blood pressure at home because he felt "a little off" after eating salty snacks. His blood pressure reading at home was 190/114. He is concerned that he may need is blood pressure medication adjusted.

## 2018-12-18 ENCOUNTER — Emergency Department (HOSPITAL_COMMUNITY)
Admission: EM | Admit: 2018-12-18 | Discharge: 2018-12-18 | Disposition: A | Discharge status: Home / Self Care | Payer: 59 | Attending: Emergency Medicine | Admitting: Emergency Medicine

## 2018-12-18 DIAGNOSIS — I10 Essential (primary) hypertension: Secondary | ICD-10-CM

## 2018-12-18 NOTE — ED Provider Notes (Signed)
TIME SEEN: 12:41 AM  CHIEF COMPLAINT: Hypertension  HPI: Patient is a 53 year old male with history of hypertension, obesity who presents to the emergency department elevated blood pressure at home.  190/114 at home.  Was "feeling off" tonight and that is why he checked.  Has been 150/95 recently.  States he does not check it regularly.  No HA now.  Has chronic HA.  No vision changes.  No CP or SOB.  No numbness or weakness.   Was on omlesartan 40 mg and HCTZ 12.5 daily and then lost 100 lb a year ago.  BP meds were reduced.  Off HCTZ.  On omlesartan 20 mg now,  Gained weight again due to COVID pandemic and thinks he may need to increase his meds again.  Dr. Pearson Grippe is PCP.  ROS: See HPI Constitutional: no fever  Eyes: no drainage  ENT: no runny nose   Cardiovascular:  no chest pain  Resp: no SOB  GI: no vomiting GU: no dysuria Integumentary: no rash  Allergy: no hives  Musculoskeletal: no leg swelling  Neurological: no slurred speech ROS otherwise negative  PAST MEDICAL HISTORY/PAST SURGICAL HISTORY:  Past Medical History:  Diagnosis Date  . Diverticulosis   . GERD (gastroesophageal reflux disease)     MEDICATIONS:  Prior to Admission medications   Medication Sig Start Date End Date Taking? Authorizing Provider  albuterol (PROVENTIL HFA;VENTOLIN HFA) 108 (90 Base) MCG/ACT inhaler Inhale 2 puffs into the lungs every 4 (four) hours as needed for wheezing or shortness of breath (cough, shortness of breath or wheezing.). 06/03/16   Sherren Mocha, MD  aspirin 325 MG tablet Take 650 mg by mouth daily.    [provider]  BENICAR 40 MG tablet TAKE 1 TABLET EVERY DAY 09/18/14   [provider]  cetirizine (ZYRTEC) 10 MG tablet Take 1 tablet (10 mg total) by mouth at bedtime. 06/03/16   Sherren Mocha, MD  hydrochlorothiazide (HYDRODIURIL) 12.5 MG tablet Take 12.5 mg by mouth daily.    [provider]  ipratropium (ATROVENT) 0.03 % nasal spray Place 2 sprays  into the nose 4 (four) times daily. 06/03/16   Sherren Mocha, MD  olmesartan (BENICAR) 20 MG tablet Take 20 mg by mouth daily.    [provider]  Probiotic Product (PROBIOTIC PO) Take by mouth.    [provider]  triamcinolone (NASACORT) 55 MCG/ACT AERO nasal inhaler Place 2 sprays into the nose daily. 06/03/16   Sherren Mocha, MD    ALLERGIES:  Allergies  Allergen Reactions  . Zestoretic [Lisinopril-Hydrochlorothiazide] Anxiety    Racing heart    SOCIAL HISTORY:  Social History   Tobacco Use  . Smoking status: Former Games developer  . Smokeless tobacco: Never Used  Substance Use Topics  . Alcohol use: No    FAMILY HISTORY: History reviewed. No pertinent family history.  EXAM: BP (!) 188/97   Pulse 80   Temp 98.7 F (37.1 C) (Oral)   Resp 16   Ht 5' 9.5" (1.765 m)   SpO2 99%   BMI 31.61 kg/m  CONSTITUTIONAL: Alert and oriented and responds appropriately to questions. Well-appearing; well-nourished HEAD: Normocephalic EYES: Conjunctivae clear, pupils appear equal, EOM appear intact ENT: normal nose; moist mucous membranes NECK: Supple, normal ROM CARD: RRR; S1 and S2 appreciated; no murmurs, no clicks, no rubs, no gallops RESP: Normal chest excursion without splinting or tachypnea; breath sounds clear and equal bilaterally; no wheezes, no rhonchi, no rales, no hypoxia or  respiratory distress, speaking full sentences ABD/GI: Normal bowel sounds; non-distended; soft, non-tender, no rebound, no guarding, no peritoneal signs, no hepatosplenomegaly BACK:  The back appears normal EXT: Normal ROM in all joints; no deformity noted, no edema; no cyanosis SKIN: Normal color for age and race; warm; no rash on exposed skin NEURO: Moves all extremities equally PSYCH: The patient's mood and manner are appropriate.   MEDICAL DECISION MAKING: Patient here with asymptomatic hypertension.  It is unclear if he is chronically this elevated or if it is related to changes in diet  today.  I recommended given he is asymptomatic that he follow-up closely with his primary care physician.  He will keep a log of his blood pressures over the next 1 to 2 weeks and follow-up with Dr. Maudie Mercury to determine if he should increase his blood pressure medication.  Given strict diet instructions as well.  Discussed strict return precautions.  Patient comfortable with this plan.  I do not feel he needs further emergent work-up at this time.    Quill Grinder was evaluated in Emergency Department on 12/18/2018 for the symptoms described in the history of present illness. He was evaluated in the context of the global COVID-19 pandemic, which necessitated consideration that the patient might be at risk for infection with the SARS-CoV-2 virus that causes COVID-19. Institutional protocols and algorithms that pertain to the evaluation of patients at risk for COVID-19 are in a state of rapid change based on information released by regulatory bodies including the CDC and federal and state organizations. These policies and algorithms were followed during the patient's care in the ED.  Patient was seen wearing N95, face shield, gloves.    Zamere Pasternak, Delice Bison, DO 12/18/18 [phone]

## 2018-12-18 NOTE — Discharge Instructions (Signed)
I recommend continue your blood pressure medication as prescribed.  I recommend that you check your blood pressure twice daily and keep a log of this.  Please follow-up with Dr. Maudie Mercury to determine if you should increase or change any of your blood pressure medications.  If you develop severe headache, vision changes such as double vision, loss of vision, flashers or floaters, chest pain or shortness of breath, facial droop, changes in speech, confusion, seizures, numbness or weakness on one side of your body, please return to the emergency department.

## 2018-12-18 NOTE — ED Notes (Signed)
EDP at bedside  

## 2019-06-07 ENCOUNTER — Ambulatory Visit (INDEPENDENT_AMBULATORY_CARE_PROVIDER_SITE_OTHER)
Admission: RE | Admit: 2019-06-07 | Discharge: 2019-06-07 | Disposition: A | Discharge status: Home / Self Care | Payer: 59 | Source: Ambulatory Visit

## 2019-06-07 DIAGNOSIS — J0110 Acute frontal sinusitis, unspecified: Secondary | ICD-10-CM

## 2019-06-07 HISTORY — DX: Essential (primary) hypertension: I10

## 2019-06-07 MED ORDER — AMOXICILLIN 875 MG PO TABS: 875.0000 mg | ORAL_TABLET | Freq: Two times a day (BID) | ORAL | 0 refills | Status: AC

## 2019-06-07 NOTE — ED Provider Notes (Signed)
Virtual Visit via Video Note:  Michael Peters  initiated request for Telemedicine visit with [facility] Urgent [facility] team. I connected with Michael Peters  on 06/07/2019 at 4:12 PM  for a synchronized telemedicine visit using a video enabled HIPPA compliant telemedicine application. I verified that I am speaking with Michael Peters  using two identifiers. Michael Bail, NP  was physically located in a [facility] Urgent care site and [name] was located at a different location.   The limitations of evaluation and management by telemedicine as well as the availability of in-person appointments were discussed. Patient was informed that he  may incur a bill ( including co-pay) for this virtual visit encounter. Michael Peters  expressed understanding and gave verbal consent to proceed with virtual visit.     History of Present Illness:Michael Peters  is a 54 y.o. male presents for evaluation of sinus congestion, frontal headache, sinus pressure, ear pressure, sore throat, nonproductive cough x 5 days.  [facility]  He denies fever, chills, shortness of breath, vomiting, diarrhea, rash, or other symptoms.  He has taken Coricidin HBP and cough drops.     Allergies  Allergen Reactions  . Zestoretic [Lisinopril-Hydrochlorothiazide] Anxiety    Racing heart     Past Medical History:  Diagnosis Date  . Diverticulosis   . GERD (gastroesophageal reflux disease)   . Hypertension      Social History   Tobacco Use  . Smoking status: Former Games developer  . Smokeless tobacco: Never Used  Substance Use Topics  . Alcohol use: No  . Drug use: Not on file   ROS: as stated in HPI.  All other systems reviewed and negative.      Observations/Objective: Physical Exam  VITALS: Patient denies fever. GENERAL: Alert, appears well and in no acute distress. HEENT: Atraumatic. NECK: Normal movements of the head and neck. CARDIOPULMONARY: No increased WOB. Speaking in clear sentences. I:E  ratio WNL.  MS: Moves all visible extremities without noticeable abnormality. PSYCH: Pleasant and cooperative, well-groomed. Speech normal rate and rhythm. Affect is appropriate. Insight and judgement are appropriate. Attention is focused, linear, and appropriate.  NEURO: CN grossly intact. Oriented as arrived to appointment on time with no prompting. Moves both UE equally.  SKIN: No obvious lesions, wounds, erythema, or cyanosis noted on face or hands.   Assessment and Plan:    ICD-10-CM   1. Acute non-recurrent frontal sinusitis  J01.10        Follow Up Instructions: Instructed patient to take plain OTC Mucinex and ibuprofen for his symptoms.  Discussed with patient that if his symptoms are not improving in the next day or so that he can start taking the amoxicillin as directed.  Instructed patient to follow-up with his PCP or come here to be seen in person if his symptoms are not improving with the medications.  Patient agrees to plan of care.      I discussed the assessment and treatment plan with the patient. The patient was provided an opportunity to ask questions and all were answered. The patient agreed with the plan and demonstrated an understanding of the instructions.   The patient was advised to call back or seek an in-person evaluation if the symptoms worsen or if the condition fails to improve as anticipated.      Michael Bail, NP  06/07/2019 4:12 PM         Michael Bail, NP 06/07/19 [phone]

## 2019-06-07 NOTE — Discharge Instructions (Signed)
Take plain over-the-counter Mucinex and ibuprofen for your symptoms.  If you are not improving or get worse, start taking the amoxicillin as directed.    Follow up with your primary care provider or come here to be seen in person if your symptoms are not improving with the medications.

## 2020-02-14 ENCOUNTER — Other Ambulatory Visit: Payer: 59

## 2020-02-14 DIAGNOSIS — Z20822 Contact with and (suspected) exposure to covid-19: Secondary | ICD-10-CM

## 2020-02-16 LAB — NOVEL CORONAVIRUS, NAA: SARS-CoV-2, NAA: NOT DETECTED

## 2020-02-16 LAB — SARS-COV-2, NAA 2 DAY TAT

## 2020-09-16 ENCOUNTER — Telehealth: Payer: 59 | Admitting: Nurse Practitioner

## 2020-09-16 ENCOUNTER — Encounter: Payer: Self-pay | Admitting: Nurse Practitioner

## 2020-09-16 DIAGNOSIS — U071 COVID-19: Secondary | ICD-10-CM

## 2020-09-16 MED ORDER — BENZONATATE 100 MG PO CAPS: 100.0000 mg | ORAL_CAPSULE | Freq: Three times a day (TID) | ORAL | 0 refills | Status: DC | PRN

## 2020-09-16 MED ORDER — MOLNUPIRAVIR EUA 200MG CAPSULE: 4.0000 | ORAL_CAPSULE | Freq: Two times a day (BID) | ORAL | 0 refills | Status: AC

## 2020-09-16 NOTE — Progress Notes (Signed)
Virtual Visit Consent   Michael Peters, you are scheduled for a virtual visit with Mary-Margaret Daphine Deutscher, FNP, a [facility] provider, today.     Just as with appointments in the office, your consent must be obtained to participate.  Your consent will be active for this visit and any virtual visit you may have with one of our providers in the next 365 days.     If you have a MyChart account, a copy of this consent can be sent to you electronically.  All virtual visits are billed to your insurance company just like a traditional visit in the office.    As this is a virtual visit, video technology does not allow for your provider to perform a traditional examination.  This may limit your provider's ability to fully assess your condition.  If your provider identifies any concerns that need to be evaluated in person or the need to arrange testing (such as labs, EKG, etc.), we will make arrangements to do so.     Although advances in technology are sophisticated, we cannot ensure that it will always work on either your end or our end.  If the connection with a video visit is poor, the visit may have to be switched to a telephone visit.  With either a video or telephone visit, we are not always able to ensure that we have a secure connection.     I need to obtain your verbal consent now.   Are you willing to proceed with your visit today? YES   Michael Peters has provided verbal consent on 09/16/2020 for a virtual visit (video or telephone).   Mary-Margaret Daphine Deutscher, FNP   Date: 09/16/2020 2:54 PM   Virtual Visit via Video Note   I, Mary-Margaret Daphine Deutscher, connected with Michael Peters ([id number], [birth year]) on 09/16/20 at  3:00 PM EDT by a video-enabled telemedicine application and verified that I am speaking with the correct person using two identifiers.  Location: Patient: Virtual Visit Location Patient: Home Provider: Virtual Visit Location Provider: Mobile   I discussed the limitations of evaluation  and management by telemedicine and the availability of in person appointments. The patient expressed understanding and agreed to proceed.    History of Present Illness: Michael Peters is a 55 y.o. who identifies as a male who was assigned male at birth, and is being seen today for covid positive.  HPI: Patient started feeling ill on Sunday afternoon with sore throat and stuffy nose. On Monday he developed a headache, that worsened throughout the day. He has 2 home covid test yesterday that were positive. He has a bad cough now. He is suppose to go out of the country on Sunday.    Review of Systems  Constitutional:  Positive for malaise/fatigue. Negative for chills and fever.  HENT:  Positive for congestion and sore throat.   Respiratory:  Positive for cough. Negative for sputum production and shortness of breath.   Musculoskeletal:  Positive for myalgias.  Neurological:  Positive for headaches.   Problems:  Patient Active Problem List   Diagnosis Date Noted   Acute pain of right knee 06/23/2016   Sprain of right knee 06/23/2016    Allergies:  Allergies  Allergen Reactions   Zestoretic [Lisinopril-Hydrochlorothiazide] Anxiety    Racing heart   Medications:  Current Outpatient Medications:    albuterol (PROVENTIL HFA;VENTOLIN HFA) 108 (90 Base) MCG/ACT inhaler, Inhale 2 puffs into the lungs every 4 (four) hours as needed for wheezing or shortness of breath (  cough, shortness of breath or wheezing.)., Disp: 1 Inhaler, Rfl: 1   aspirin 325 MG tablet, Take 650 mg by mouth daily., Disp: , Rfl:    BENICAR 40 MG tablet, TAKE 1 TABLET EVERY DAY, Disp: , Rfl: 1   cetirizine (ZYRTEC) 10 MG tablet, Take 1 tablet (10 mg total) by mouth at bedtime., Disp: 30 tablet, Rfl: 11   hydrochlorothiazide (HYDRODIURIL) 12.5 MG tablet, Take 12.5 mg by mouth daily., Disp: , Rfl:    ipratropium (ATROVENT) 0.03 % nasal spray, Place 2 sprays into the nose 4 (four) times daily., Disp: 30 mL, Rfl: 1   olmesartan  (BENICAR) 20 MG tablet, Take 20 mg by mouth daily., Disp: , Rfl:    Probiotic Product (PROBIOTIC PO), Take by mouth., Disp: , Rfl:    triamcinolone (NASACORT) 55 MCG/ACT AERO nasal inhaler, Place 2 sprays into the nose daily., Disp: 1 Inhaler, Rfl: 12  Observations/Objective: Patient is well-developed, well-nourished in no acute distress.  Resting comfortably  at home.  Head is normocephalic, atraumatic.  No labored breathing.  Speech is clear and coherent with logical content.  Patient is alert and oriented at baseline.  Dry cough  Assessment and Plan:  Sharion Dove in today with chief complaint of Covid Positive (/)   1. Lab test positive for detection of COVID-19 virus 1. Take meds as prescribed 2. Use a cool mist humidifier especially during the winter months and when heat has been humid. 3. Use saline nose sprays frequently 4. Saline irrigations of the nose can be very helpful if done frequently.  * 4X daily for 1 week*  * Use of a nettie pot can be helpful with this. Follow directions with this* 5. Drink plenty of fluids 6. Keep thermostat turn down low 7.For any cough or congestion  Use plain Mucinex- regular strength or max strength is fine   * Children- consult with Pharmacist for dosing 8. For fever or aces or pains- take tylenol or ibuprofen appropriate for age and weight.  * for fevers greater than 101 orally you may alternate ibuprofen and tylenol every  3 hours.    - benzonatate (TESSALON PERLES) 100 MG capsule; Take 1 capsule (100 mg total) by mouth 3 (three) times daily as needed for cough.  Dispense: 20 capsule; Refill: 0 - molnupiravir EUA 200 mg CAPS; Take 4 capsules (800 mg total) by mouth 2 (two) times daily for 5 days.  Dispense: 40 capsule; Refill: 0     Follow Up Instructions: I discussed the assessment and treatment plan with the patient. The patient was provided an opportunity to ask questions and all were answered. The patient agreed with the plan  and demonstrated an understanding of the instructions.  A copy of instructions were sent to the patient via MyChart.  The patient was advised to call back or seek an in-person evaluation if the symptoms worsen or if the condition fails to improve as anticipated.  Time:  I spent 10 minutes with the patient via telehealth technology discussing the above problems/concerns.    Mary-Margaret Daphine Deutscher, FNP

## 2020-09-16 NOTE — Patient Instructions (Signed)
You are being prescribed MOLNUPIRAVIR for COVID-19 infection.     [facility] [place]   Please call the pharmacy or go through the drive through vs going inside if you are picking up the mediation yourself to prevent further spread. If prescribed to a [facility] affiliated pharmacy, a pharmacist will bring the medication out to your car.   ADMINISTRATION INSTRUCTIONS: Take with or without food. Swallow the tablets whole. Don't chew, crush, or break the medications because it might not work as well  For each dose of the medication, you should be taking FOUR tablets at one time, TWICE a day   Finish your full five-day course of Molnupiravir even if you feel better before you're done. Stopping this medication too early can make it less effective to prevent severe illness related to COVID19.    Molnupiravir is prescribed for YOU ONLY. Don't share it with others, even if they have similar symptoms as you. This medication might not be right for everyone.   Make sure to take steps to protect yourself and others while you're taking this medication in order to get well soon and to prevent others from getting sick with COVID-19.   **If you are of childbearing potential (any gender) - it is advised to not get pregnant while taking this medication and recommended that condoms are used for male partners the next 3 months after taking the medication out of extreme caution    COMMON SIDE EFFECTS: Diarrhea Nausea  Dizziness    If your COVID-19 symptoms get worse, get medical help right away. Call 911 if you experience symptoms such as worsening cough, trouble breathing, chest pain that doesn't go away, confusion, a hard time staying awake, and pale or blue-colored skin. This medication won't prevent all COVID-19 cases from getting worse.

## 2021-01-05 ENCOUNTER — Other Ambulatory Visit: Payer: Self-pay

## 2021-01-05 ENCOUNTER — Emergency Department: Payer: 59

## 2021-01-05 ENCOUNTER — Emergency Department
Admission: EM | Admit: 2021-01-05 | Discharge: 2021-01-05 | Disposition: A | Discharge status: Home / Self Care | Payer: 59 | Attending: Emergency Medicine | Admitting: Emergency Medicine

## 2021-01-05 DIAGNOSIS — I10 Essential (primary) hypertension: Secondary | ICD-10-CM | POA: Diagnosis not present

## 2021-01-05 DIAGNOSIS — Z79899 Other long term (current) drug therapy: Secondary | ICD-10-CM | POA: Insufficient documentation

## 2021-01-05 DIAGNOSIS — Z7982 Long term (current) use of aspirin: Secondary | ICD-10-CM | POA: Diagnosis not present

## 2021-01-05 DIAGNOSIS — K219 Gastro-esophageal reflux disease without esophagitis: Secondary | ICD-10-CM | POA: Diagnosis not present

## 2021-01-05 DIAGNOSIS — Z87891 Personal history of nicotine dependence: Secondary | ICD-10-CM | POA: Diagnosis not present

## 2021-01-05 DIAGNOSIS — R1013 Epigastric pain: Secondary | ICD-10-CM | POA: Diagnosis present

## 2021-01-05 LAB — CBC
HCT: 43.8 % (ref 39.0–52.0)
Hemoglobin: 15.8 g/dL (ref 13.0–17.0)
MCH: 32.8 pg (ref 26.0–34.0)
MCHC: 36.1 g/dL — ABNORMAL HIGH (ref 30.0–36.0)
MCV: 90.9 fL (ref 80.0–100.0)
Platelets: 178 10*3/uL (ref 150–400)
RBC: 4.82 MIL/uL (ref 4.22–5.81)
RDW: 12.3 % (ref 11.5–15.5)
WBC: 5.8 10*3/uL (ref 4.0–10.5)
nRBC: 0 % (ref 0.0–0.2)

## 2021-01-05 LAB — BASIC METABOLIC PANEL
Anion gap: 6 (ref 5–15)
BUN: 14 mg/dL (ref 6–20)
CO2: 29 mmol/L (ref 22–32)
Calcium: 9.1 mg/dL (ref 8.9–10.3)
Chloride: 101 mmol/L (ref 98–111)
Creatinine, Ser: 0.72 mg/dL (ref 0.61–1.24)
GFR, Estimated: >60 mL/min (ref >60)
Glucose, Bld: 103 mg/dL — ABNORMAL HIGH (ref 70–99)
Potassium: 3.5 mmol/L (ref 3.5–5.1)
Sodium: 136 mmol/L (ref 135–145)

## 2021-01-05 LAB — TROPONIN I (HIGH SENSITIVITY)
Troponin I (High Sensitivity): 6 ng/L (ref <18)
Troponin I (High Sensitivity): 6 ng/L (ref <18)

## 2021-01-05 MED ORDER — ALUM & MAG HYDROXIDE-SIMETH 200-200-20 MG/5ML PO SUSP
30.0000 mL | Freq: Once | ORAL | Status: AC
  Administered 2021-01-05: 30 mL via ORAL
  Filled 2021-01-05: qty 30

## 2021-01-05 MED ORDER — METOCLOPRAMIDE HCL 10 MG PO TABS: 10.0000 mg | ORAL_TABLET | Freq: Four times a day (QID) | ORAL | 0 refills | Status: DC | PRN

## 2021-01-05 MED ORDER — METOCLOPRAMIDE HCL 10 MG PO TABS
10.0000 mg | ORAL_TABLET | Freq: Once | ORAL | Status: AC
  Administered 2021-01-05: 10 mg via ORAL
  Filled 2021-01-05: qty 1

## 2021-01-05 MED ORDER — FAMOTIDINE 20 MG PO TABS: 20.0000 mg | ORAL_TABLET | Freq: Two times a day (BID) | ORAL | 0 refills | Status: DC

## 2021-01-05 MED ORDER — ALUMINUM-MAGNESIUM-SIMETHICONE 200-200-20 MG/5ML PO SUSP: 30.0000 mL | Freq: Three times a day (TID) | ORAL | 0 refills | Status: DC

## 2021-01-05 MED ORDER — FAMOTIDINE 20 MG PO TABS
40.0000 mg | ORAL_TABLET | Freq: Once | ORAL | Status: AC
  Administered 2021-01-05: 40 mg via ORAL
  Filled 2021-01-05: qty 2

## 2021-01-05 NOTE — ED Triage Notes (Signed)
Pt to ED for centralized chest pain radiating to left arm that started yesterday with dizziness. +shob. +nausea Hx HTN

## 2021-01-05 NOTE — ED Provider Notes (Signed)
[facility] Emergency Department Provider Note  ____________________________________________  Time seen: Approximately 11:24 AM  I have reviewed the triage vital signs and the nursing notes.   HISTORY  Chief Complaint Chest Pain    HPI Michael Peters is a 55 y.o. male with a history of GERD and hypertension who comes ED complaining of epigastric pain radiating To the throat, feels like burning, started last night, waxing and waning, better with being upright and walking, worse laying down.  Associated with feeling of shortness of breath and nausea, no vomiting or diaphoresis.  No exertional symptoms, not pleuritic.  Currently mild intensity.    Past Medical History:  Diagnosis Date   Diverticulosis    GERD (gastroesophageal reflux disease)    Hypertension      Patient Active Problem List   Diagnosis Date Noted   Acute pain of right knee 06/23/2016   Sprain of right knee 06/23/2016     Past Surgical History:  Procedure Laterality Date   HERNIA REPAIR     bilateral inguinal     Prior to Admission medications   Medication Sig Start Date End Date Taking? Authorizing Provider  aluminum-magnesium hydroxide-simethicone (MAALOX) 200-200-20 MG/5ML SUSP Take 30 mLs by mouth 4 (four) times daily -  before meals and at bedtime. 01/05/21  Yes Sharman Cheek, MD  famotidine (PEPCID) 20 MG tablet Take 1 tablet (20 mg total) by mouth 2 (two) times daily. 01/05/21  Yes Sharman Cheek, MD  metoCLOPramide (REGLAN) 10 MG tablet Take 1 tablet (10 mg total) by mouth every 6 (six) hours as needed. 01/05/21  Yes Sharman Cheek, MD  albuterol (PROVENTIL HFA;VENTOLIN HFA) 108 (90 Base) MCG/ACT inhaler Inhale 2 puffs into the lungs every 4 (four) hours as needed for wheezing or shortness of breath (cough, shortness of breath or wheezing.). 06/03/16   Sherren Mocha, MD  aspirin 325 MG tablet Take 650 mg by mouth daily.    [provider]  BENICAR 40 MG tablet  TAKE 1 TABLET EVERY DAY 09/18/14   [provider]  benzonatate (TESSALON PERLES) 100 MG capsule Take 1 capsule (100 mg total) by mouth 3 (three) times daily as needed for cough. 09/16/20   Daphine Deutscher Mary-Margaret, FNP  cetirizine (ZYRTEC) 10 MG tablet Take 1 tablet (10 mg total) by mouth at bedtime. 06/03/16   Sherren Mocha, MD  hydrochlorothiazide (HYDRODIURIL) 12.5 MG tablet Take 12.5 mg by mouth daily.    [provider]  ipratropium (ATROVENT) 0.03 % nasal spray Place 2 sprays into the nose 4 (four) times daily. 06/03/16   Sherren Mocha, MD  olmesartan (BENICAR) 20 MG tablet Take 20 mg by mouth daily.    [provider]  Probiotic Product (PROBIOTIC PO) Take by mouth.    [provider]  triamcinolone (NASACORT) 55 MCG/ACT AERO nasal inhaler Place 2 sprays into the nose daily. 06/03/16   Sherren Mocha, MD     Allergies Zestoretic [lisinopril-hydrochlorothiazide]   No family history on file.  Social History Social History   Tobacco Use   Smoking status: Former   Smokeless tobacco: Never  Substance Use Topics   Alcohol use: No    Review of Systems  Constitutional:   No fever or chills.  ENT:   No sore throat. No rhinorrhea. Cardiovascular:   No chest pain or syncope. Respiratory:   No dyspnea or cough. Gastrointestinal: Positive for epigastric pain without vomiting and diarrhea.  Musculoskeletal:   Negative for focal pain or swelling  All other systems reviewed and are negative except as documented above in ROS and HPI.  ____________________________________________   PHYSICAL EXAM:  VITAL SIGNS: ED Triage Vitals  Enc Vitals Group     BP 01/05/21 0814 (!) 162/90     Pulse Rate 01/05/21 0814 83     Resp 01/05/21 0814 20     Temp 01/05/21 0814 98.7 F (37.1 C)     Temp Source 01/05/21 0814 Oral     SpO2 01/05/21 0814 96 %     Weight 01/05/21 0815 270 lb (122.5 kg)     Height 01/05/21 0815 5\' 9"  (1.753 m)     Head Circumference --      Peak  Flow --      Pain Score 01/05/21 0815 4     Pain Loc --      Pain Edu? --      Excl. in GC? --     Vital signs reviewed, nursing assessments reviewed.   Constitutional:   Alert and oriented. Non-toxic appearance. Eyes:   Conjunctivae are normal. EOMI. PERRL. ENT      Head:   Normocephalic and atraumatic.      Nose:   Wearing a mask.      Mouth/Throat:   Wearing a mask.      Neck:   No meningismus. Full ROM. Hematological/Lymphatic/Immunilogical:   No cervical lymphadenopathy. Cardiovascular:   RRR. Symmetric bilateral radial and DP pulses.  No murmurs. Cap refill less than 2 seconds. Respiratory:   Normal respiratory effort without tachypnea/retractions. Breath sounds are clear and equal bilaterally. No wheezes/rales/rhonchi. Gastrointestinal:   Positive for mild left upper quadrant tenderness. Non distended. There is no CVA tenderness.  No rebound, rigidity, or guarding. Genitourinary:   deferred Musculoskeletal:   Normal range of motion in all extremities. No joint effusions.  No lower extremity tenderness.  No edema. Neurologic:   Normal speech and language.  Motor grossly intact. No acute focal neurologic deficits are appreciated.  Skin:    Skin is warm, dry and intact. No rash noted.  No petechiae, purpura, or bullae.  ____________________________________________    LABS (pertinent positives/negatives) (all labs ordered are listed, but only abnormal results are displayed) Labs Reviewed  BASIC METABOLIC PANEL - Abnormal; Notable for the following components:      Result Value   Glucose, Bld 103 (*)    All other components within normal limits  CBC - Abnormal; Notable for the following components:   MCHC 36.1 (*)    All other components within normal limits  TROPONIN I (HIGH SENSITIVITY)  TROPONIN I (HIGH SENSITIVITY)   ____________________________________________   EKG  Interpreted by me  Date: 01/05/2021  Rate: 72  Rhythm: normal sinus rhythm  QRS Axis:  normal  Intervals: normal  ST/T Wave abnormalities: normal  Conduction Disutrbances: none  Narrative Interpretation: unremarkable     ____________________________________________    RADIOLOGY  DG Chest 2 View  Result Date: 01/05/2021 CLINICAL DATA:  Central chest pain radiating to the left arm beginning yesterday. Dizziness. EXAM: CHEST - 2 VIEW COMPARISON:  06/03/2016 FINDINGS: The heart size and mediastinal contours are within normal limits. Both lungs are clear. The visualized skeletal structures are unremarkable. Incidental azygous fissure. IMPRESSION: No active cardiopulmonary disease. Electronically Signed   By: 08/03/2016 M.D.   On: 01/05/2021 08:58    ____________________________________________   PROCEDURES Procedures  ____________________________________________    CLINICAL IMPRESSION / ASSESSMENT AND PLAN / ED COURSE  Medications ordered in the ED:  Medications  famotidine (PEPCID) tablet 40 mg (40 mg Oral Given 01/05/21 0955)  metoCLOPramide (REGLAN) tablet 10 mg (10 mg Oral Given 01/05/21 0956)  alum & mag hydroxide-simeth (MAALOX/MYLANTA) 200-200-20 MG/5ML suspension 30 mL (30 mLs Oral Given 01/05/21 0957)    Pertinent labs & imaging results that were available during my care of the patient were reviewed by me and considered in my medical decision making (see chart for details).  Michael Peters was evaluated in Emergency Department on 01/05/2021 for the symptoms described in the history of present illness. He was evaluated in the context of the global COVID-19 pandemic, which necessitated consideration that the patient might be at risk for infection with the SARS-CoV-2 virus that causes COVID-19. Institutional protocols and algorithms that pertain to the evaluation of patients at risk for COVID-19 are in a state of rapid change based on information released by regulatory bodies including the CDC and federal and state organizations. These policies and algorithms  were followed during the patient's care in the ED.   Patient presents with epigastric pain/atypical chest pain, very consistent with GERD.  Has some left upper quadrant tenderness as well consistent with gastritis.  He is on omeprazole which does not seem to be working right now so have him take Pepcid Reglan and Maalox.  EKG, chest x-ray, serum labs including serial troponins are all unremarkable in the ED.  Stable for discharge.   Considering the patient's symptoms, medical history, and physical examination today, I have low suspicion for ACS, PE, TAD, pneumothorax, carditis, mediastinitis, pneumonia, CHF, or sepsis.       ____________________________________________   FINAL CLINICAL IMPRESSION(S) / ED DIAGNOSES    Final diagnoses:  Gastroesophageal reflux disease without esophagitis     ED Discharge Orders          Ordered    metoCLOPramide (REGLAN) 10 MG tablet  Every 6 hours PRN        01/05/21 1124    famotidine (PEPCID) 20 MG tablet  2 times daily        01/05/21 1124    aluminum-magnesium hydroxide-simethicone (MAALOX) 200-200-20 MG/5ML SUSP  3 times daily before meals & bedtime        01/05/21 1124            Portions of this note were generated with dragon dictation software. Dictation errors may occur despite best attempts at proofreading.    Sharman Cheek, MD 01/05/21 [phone]

## 2021-01-05 NOTE — Discharge Instructions (Addendum)
Your EKG, chest x-ray, and lab tests are all normal today.

## 2021-07-20 ENCOUNTER — Other Ambulatory Visit: Payer: Self-pay

## 2021-07-20 ENCOUNTER — Emergency Department: Payer: 59

## 2021-07-20 ENCOUNTER — Emergency Department
Admission: EM | Admit: 2021-07-20 | Discharge: 2021-07-20 | Disposition: A | Discharge status: Home / Self Care | Payer: 59 | Attending: Emergency Medicine | Admitting: Emergency Medicine

## 2021-07-20 ENCOUNTER — Ambulatory Visit: Payer: 59

## 2021-07-20 DIAGNOSIS — K760 Fatty (change of) liver, not elsewhere classified: Secondary | ICD-10-CM | POA: Diagnosis not present

## 2021-07-20 DIAGNOSIS — M62838 Other muscle spasm: Secondary | ICD-10-CM | POA: Insufficient documentation

## 2021-07-20 DIAGNOSIS — R319 Hematuria, unspecified: Secondary | ICD-10-CM | POA: Insufficient documentation

## 2021-07-20 DIAGNOSIS — I10 Essential (primary) hypertension: Secondary | ICD-10-CM | POA: Insufficient documentation

## 2021-07-20 DIAGNOSIS — R109 Unspecified abdominal pain: Secondary | ICD-10-CM | POA: Diagnosis present

## 2021-07-20 DIAGNOSIS — R1013 Epigastric pain: Secondary | ICD-10-CM | POA: Diagnosis not present

## 2021-07-20 LAB — CBC
HCT: 43.4 % (ref 39.0–52.0)
Hemoglobin: 15.2 g/dL (ref 13.0–17.0)
MCH: 32.3 pg (ref 26.0–34.0)
MCHC: 35 g/dL (ref 30.0–36.0)
MCV: 92.3 fL (ref 80.0–100.0)
Platelets: 179 10*3/uL (ref 150–400)
RBC: 4.7 MIL/uL (ref 4.22–5.81)
RDW: 12.3 % (ref 11.5–15.5)
WBC: 5.9 10*3/uL (ref 4.0–10.5)
nRBC: 0 % (ref 0.0–0.2)

## 2021-07-20 LAB — LIPASE, BLOOD: Lipase: 27 U/L (ref 11–51)

## 2021-07-20 LAB — URINALYSIS, ROUTINE W REFLEX MICROSCOPIC
Bacteria, UA: NONE SEEN
Bilirubin Urine: NEGATIVE
Glucose, UA: NEGATIVE mg/dL
Ketones, ur: NEGATIVE mg/dL
Leukocytes,Ua: NEGATIVE
Nitrite: NEGATIVE
Protein, ur: NEGATIVE mg/dL
Specific Gravity, Urine: 1.011 (ref 1.005–1.030)
Squamous Epithelial / HPF: NONE SEEN (ref 0–5)
pH: 7 (ref 5.0–8.0)

## 2021-07-20 LAB — TROPONIN I (HIGH SENSITIVITY)
Troponin I (High Sensitivity): 3 ng/L (ref <18)
Troponin I (High Sensitivity): 3 ng/L (ref <18)

## 2021-07-20 LAB — COMPREHENSIVE METABOLIC PANEL
ALT: 50 U/L — ABNORMAL HIGH (ref 0–44)
AST: 40 U/L (ref 15–41)
Albumin: 4.2 g/dL (ref 3.5–5.0)
Alkaline Phosphatase: 48 U/L (ref 38–126)
Anion gap: 6 (ref 5–15)
BUN: 15 mg/dL (ref 6–20)
CO2: 27 mmol/L (ref 22–32)
Calcium: 9.3 mg/dL (ref 8.9–10.3)
Chloride: 105 mmol/L (ref 98–111)
Creatinine, Ser: 0.79 mg/dL (ref 0.61–1.24)
GFR, Estimated: >60 mL/min (ref >60)
Glucose, Bld: 133 mg/dL — ABNORMAL HIGH (ref 70–99)
Potassium: 3.5 mmol/L (ref 3.5–5.1)
Sodium: 138 mmol/L (ref 135–145)
Total Bilirubin: 0.9 mg/dL (ref 0.3–1.2)
Total Protein: 7.3 g/dL (ref 6.5–8.1)

## 2021-07-20 MED ORDER — SUCRALFATE 1 G PO TABS: 1.0000 g | ORAL_TABLET | Freq: Four times a day (QID) | ORAL | 0 refills | Status: DC

## 2021-07-20 MED ORDER — PANTOPRAZOLE SODIUM 40 MG PO TBEC: 40.0000 mg | DELAYED_RELEASE_TABLET | Freq: Every day | ORAL | 0 refills | Status: DC

## 2021-07-20 MED ORDER — LIDOCAINE 5 % EX PTCH
1.0000 | MEDICATED_PATCH | CUTANEOUS | Status: DC
  Administered 2021-07-20: 1 via TRANSDERMAL
  Filled 2021-07-20: qty 1

## 2021-07-20 MED ORDER — SUCRALFATE 1 G PO TABS
1.0000 g | ORAL_TABLET | Freq: Once | ORAL | Status: AC
  Administered 2021-07-20: 1 g via ORAL
  Filled 2021-07-20: qty 1

## 2021-07-20 MED ORDER — ALUM & MAG HYDROXIDE-SIMETH 200-200-20 MG/5ML PO SUSP
30.0000 mL | Freq: Once | ORAL | Status: AC
  Administered 2021-07-20: 30 mL via ORAL
  Filled 2021-07-20: qty 30

## 2022-05-16 ENCOUNTER — Other Ambulatory Visit (HOSPITAL_BASED_OUTPATIENT_CLINIC_OR_DEPARTMENT_OTHER): Payer: Self-pay

## 2022-05-16 DIAGNOSIS — E785 Hyperlipidemia, unspecified: Secondary | ICD-10-CM

## 2022-06-10 ENCOUNTER — Ambulatory Visit (HOSPITAL_COMMUNITY)
Admission: RE | Admit: 2022-06-10 | Discharge: 2022-06-10 | Disposition: A | Discharge status: Home / Self Care | Payer: Self-pay | Source: Ambulatory Visit

## 2022-06-10 DIAGNOSIS — E785 Hyperlipidemia, unspecified: Secondary | ICD-10-CM | POA: Insufficient documentation

## 2022-07-07 ENCOUNTER — Ambulatory Visit
Admission: EM | Admit: 2022-07-07 | Discharge: 2022-07-07 | Disposition: A | Discharge status: Home / Self Care | Payer: 59 | Attending: Internal Medicine | Admitting: Internal Medicine

## 2022-07-07 DIAGNOSIS — K219 Gastro-esophageal reflux disease without esophagitis: Secondary | ICD-10-CM

## 2022-07-07 DIAGNOSIS — R55 Syncope and collapse: Secondary | ICD-10-CM

## 2022-07-07 DIAGNOSIS — I10 Essential (primary) hypertension: Secondary | ICD-10-CM | POA: Diagnosis not present

## 2022-07-07 MED ORDER — OMEPRAZOLE 40 MG PO CPDR: 40.0000 mg | DELAYED_RELEASE_CAPSULE | Freq: Every day | ORAL | 0 refills | Status: DC

## 2022-07-07 NOTE — ED Triage Notes (Signed)
Pt reports a bad case of GERD yesterday . Had improved now some midepigastric discomfort with episodes of feeling light headed and nauseated.  Took OTC antacids as well as alka seltzers.   Also concerned with sinus infection. Is finishing up his abx for it but states he still has a stuffy nose.

## 2022-07-07 NOTE — Discharge Instructions (Addendum)
I have re-prescribed a proton pump inhibitor which will help reduce acid production in your stomach and should improve your symptoms of GERD.  Please follow-up with your GI provider.  Recommend taking your blood pressure at home periodically and keep a log.  Follow-up with your primary care provider.

## 2022-07-07 NOTE — ED Provider Notes (Signed)
Michael Peters    CSN: [id number] Arrival date & time: 07/07/22  1657      History   Chief Complaint Chief Complaint  Patient presents with   Abdominal Pain   Near Syncope    HPI Michael Peters is a 57 y.o. male.    Abdominal Pain Near Syncope Associated symptoms include abdominal pain.    Patient presents to urgent care with epigastric discomfort today, along with episodes of feeling lightheaded and nauseated.  He reports experiencing a "bad case" of GERD yesterday.  Using OTC antacids as well as Alka-Seltzer.  Patient also expresses concern with sinus infection for which she has been treated with antibiotics.  Past Medical History:  Diagnosis Date   Diverticulosis    GERD (gastroesophageal reflux disease)    Hypertension     Patient Active Problem List   Diagnosis Date Noted   Acute pain of right knee 06/23/2016   Sprain of right knee 06/23/2016    Past Surgical History:  Procedure Laterality Date   HERNIA REPAIR     bilateral inguinal       Home Medications    Prior to Admission medications   Medication Sig Start Date End Date Taking? Authorizing Provider  aluminum-magnesium hydroxide-simethicone (MAALOX) 200-200-20 MG/5ML SUSP Take 30 mLs by mouth 4 (four) times daily -  before meals and at bedtime. 01/05/21  Yes Sharman Cheek, MD  hydrochlorothiazide (HYDRODIURIL) 12.5 MG tablet Take 25 mg by mouth daily.   Yes [provider]  olmesartan (BENICAR) 40 MG tablet Take 40 mg by mouth daily.   Yes [provider]  albuterol (PROVENTIL HFA;VENTOLIN HFA) 108 (90 Base) MCG/ACT inhaler Inhale 2 puffs into the lungs every 4 (four) hours as needed for wheezing or shortness of breath (cough, shortness of breath or wheezing.). 06/03/16   Sherren Mocha, MD  BENICAR 40 MG tablet TAKE 1 TABLET EVERY DAY 09/18/14   [provider]  benzonatate (TESSALON PERLES) 100 MG capsule Take 1 capsule (100 mg total) by mouth 3 (three) times daily  as needed for cough. Patient not taking: Reported on 07/20/2021 09/16/20   Bennie Pierini, FNP  cetirizine (ZYRTEC) 10 MG tablet Take 1 tablet (10 mg total) by mouth at bedtime. 06/03/16   Sherren Mocha, MD  famotidine (PEPCID) 20 MG tablet Take 1 tablet (20 mg total) by mouth 2 (two) times daily. 01/05/21   Sharman Cheek, MD  ipratropium (ATROVENT) 0.03 % nasal spray Place 2 sprays into the nose 4 (four) times daily. Patient not taking: Reported on 07/20/2021 06/03/16   Sherren Mocha, MD  metoCLOPramide (REGLAN) 10 MG tablet Take 1 tablet (10 mg total) by mouth every 6 (six) hours as needed. Patient not taking: Reported on 07/20/2021 01/05/21   Sharman Cheek, MD  pantoprazole (PROTONIX) 40 MG tablet Take 1 tablet (40 mg total) by mouth daily. 07/20/21 08/19/21  Gilles Chiquito, MD  Probiotic Product (PROBIOTIC PO) Take by mouth.    [provider]  sucralfate (CARAFATE) 1 g tablet Take 1 tablet (1 g total) by mouth 4 (four) times daily for 5 days. 07/20/21 07/25/21  Gilles Chiquito, MD  triamcinolone (NASACORT) 55 MCG/ACT AERO nasal inhaler Place 2 sprays into the nose daily. 06/03/16   Sherren Mocha, MD    Family History No family history on file.  Social History Social History   Tobacco Use   Smoking status: Former   Smokeless tobacco: Never  Substance Use Topics   Alcohol  use: Yes    Comment: rare   Drug use: Never     Allergies   Zestoretic [lisinopril-hydrochlorothiazide]   Review of Systems Review of Systems  Cardiovascular:  Positive for near-syncope.  Gastrointestinal:  Positive for abdominal pain.    Physical Exam Triage Vital Signs ED Triage Vitals  Enc Vitals Group     BP 07/07/22 1727 (!) 181/107     Pulse Rate 07/07/22 1727 69     Resp 07/07/22 1727 16     Temp 07/07/22 1727 98.3 F (36.8 C)     Temp Source 07/07/22 1727 Oral     SpO2 07/07/22 1727 97 %     Weight --      Height --      Head Circumference --      Peak Flow --      Pain  Score 07/07/22 1721 0     Pain Loc --      Pain Edu? --      Excl. in GC? --    No data found.  Updated Vital Signs BP (!) 181/107 (BP Location: Left Arm)   Pulse 69   Temp 98.3 F (36.8 C) (Oral)   Resp 16   SpO2 97%   Visual Acuity Right Eye Distance:   Left Eye Distance:   Bilateral Distance:    Right Eye Near:   Left Eye Near:    Bilateral Near:     Physical Exam Vitals reviewed.  Constitutional:      Appearance: He is well-developed. He is not ill-appearing.  Cardiovascular:     Rate and Rhythm: Normal rate and regular rhythm.     Heart sounds: Normal heart sounds.  Pulmonary:     Effort: Pulmonary effort is normal.     Breath sounds: Normal breath sounds.  Abdominal:     General: Abdomen is flat. Bowel sounds are normal.     Palpations: Abdomen is soft.     Tenderness: There is abdominal tenderness in the epigastric area.  Skin:    General: Skin is warm and dry.  Neurological:     General: No focal deficit present.     Mental Status: He is alert and oriented to person, place, and time.  Psychiatric:        Mood and Affect: Mood normal.        Behavior: Behavior normal.      UC Treatments / Results  Labs (all labs ordered are listed, but only abnormal results are displayed) Labs Reviewed - No data to display  EKG   Radiology No results found.  Procedures Procedures (including critical care time)  Medications Ordered in UC Medications - No data to display  Initial Impression / Assessment and Plan / UC Course  I have reviewed the triage vital signs and the nursing notes.  Pertinent labs & imaging results that were available during my care of the patient were reviewed by me and considered in my medical decision making (see chart for details).   Michael Peters is a 57 y.o. male presenting with near syncope and epigastric pain. Patient is afebrile without recent antipyretics, satting well on room air. Overall is well appearing, well hydrated,  without respiratory distress. Pulmonary exam is unremarkable.  Lungs CTAB without wheezing, rhonchi, rales. RRR.  Soft with epigastric tenderness.  Reviewed relevant chart history.  Multiple symptoms with multiple differential diagnoses:  Dizziness and near syncope.  Patient with recent history of sinusitis and continued feeling of ear pressure.  I suspect this is related to his feeling of dizziness.  Also considering dizziness related to his hypertensive measurements today.  Hypertension.  Elevated blood pressure was measured today.  This is possibly related to his expressed concern to the patient.  He is in the process of having his blood pressure medicines adjusted by his PCP and recently changed dose of olmesartan from 20 mg to 40 mg.  Asked the patient to obtain a BP measuring device for home use to keep a log to present to his provider.  Epigastric pain.  Patient has been under the care of of a GI provider and his last visit is unclear.  He was seen by ED 1 year ago and prescribed PPI for a short period of time.  He states his symptoms improved but the prescription expired and he is provider did not refill.  It is possible that GI provider did not see him after this visit recalls last seen the provider 1 to 2 years ago.  He endorses EGD which showed ulcerations in his stomach.  Will represcribe a PPI and advised the patient to follow-up with his GI provider.  Facilitated scheduling the patient with a new PCP at his request.  Counseled patient on potential for adverse effects with medications prescribed/recommended today, ER and return-to-clinic precautions discussed, patient verbalized understanding and agreement with care plan.  Final Clinical Impressions(s) / UC Diagnoses   Final diagnoses:  Near syncope   Discharge Instructions   None    ED Prescriptions   None    PDMP not reviewed this encounter.   Charma Igo, [place] 07/07/22 1836

## 2022-07-13 ENCOUNTER — Encounter: Payer: Self-pay | Admitting: Cardiology

## 2022-07-13 ENCOUNTER — Ambulatory Visit: Payer: 59 | Admitting: Cardiology

## 2022-07-13 VITALS — BP 174/100 | HR 76 | Ht 69.5 in | Wt 257.0 lb

## 2022-07-13 DIAGNOSIS — K760 Fatty (change of) liver, not elsewhere classified: Secondary | ICD-10-CM

## 2022-07-13 DIAGNOSIS — E6609 Other obesity due to excess calories: Secondary | ICD-10-CM

## 2022-07-13 DIAGNOSIS — I10 Essential (primary) hypertension: Secondary | ICD-10-CM

## 2022-07-13 MED ORDER — NEBIVOLOL HCL 20 MG PO TABS: 20.0000 mg | ORAL_TABLET | Freq: Every evening | ORAL | 2 refills | Status: DC

## 2022-07-13 NOTE — Progress Notes (Signed)
Primary Physician/Referring:  Linus Galas, NP  Patient ID: Michael Peters, male    DOB: [birth year], 57 y.o.   MRN: [id number]  Chief Complaint  Patient presents with   aortic atherosclerosis   New Patient (Initial Visit)   HPI:    Michael Peters  is a 57 y.o. Caucasian male patient with hypertension, moderate obesity referred to me for evaluation of near syncopal spells and dizziness had seen him about 6 years ago, he has hypertension and obesity has risk factors.  He does not smoke, drinks occasionally and rarely.  Denies symptoms suggestive of sleep apnea.  He was evaluated for dizziness and near syncope in the emergency room recently.  Past Medical History:  Diagnosis Date   Diverticulosis    GERD (gastroesophageal reflux disease)    Hypertension    Past Surgical History:  Procedure Laterality Date   HERNIA REPAIR     bilateral inguinal   Family History  Adopted: Yes  Problem Relation Age of Onset   Obesity Mother    Liver disease Father    Diabetes Father     Social History   Tobacco Use   Smoking status: Former    Years: 25    Types: Cigarettes    Quit date: 2009    Years since quitting: 15.4   Smokeless tobacco: Never  Substance Use Topics   Alcohol use: Yes    Comment: rare   Marital Status: Single  ROS  Review of Systems  Cardiovascular:  Negative for chest pain, dyspnea on exertion and leg swelling.   Objective      07/13/2022    3:27 PM 07/13/2022    3:12 PM 07/07/2022    5:27 PM  Vitals with BMI  Height  5' 9.5"   Weight  257 lbs   BMI  37.42   Systolic 174 168 409  Diastolic 100 92 107  Pulse 76 81 69   Blood pressure (!) 174/100, pulse 76, height 5' 9.5" (1.765 m), weight 257 lb (116.6 kg), SpO2 96 %.  Physical Exam Constitutional:      Appearance: He is obese.  Neck:     Vascular: No carotid bruit or JVD.  Cardiovascular:     Rate and Rhythm: Normal rate and regular rhythm.     Pulses: Intact distal pulses.     Heart sounds: Normal  heart sounds. No murmur heard.    No gallop.  Pulmonary:     Effort: Pulmonary effort is normal.     Breath sounds: Normal breath sounds.  Abdominal:     General: Bowel sounds are normal.     Palpations: Abdomen is soft.  Musculoskeletal:     Right lower leg: No edema.     Left lower leg: No edema.    Laboratory examination:   Recent Labs    07/20/21 1601  NA 138  K 3.5  CL 105  CO2 27  GLUCOSE 133*  BUN 15  CREATININE 0.79  CALCIUM 9.3  GFRNONAA >60    Lab Results  Component Value Date   GLUCOSE 133 (H) 07/20/2021   NA 138 07/20/2021   K 3.5 07/20/2021   CL 105 07/20/2021   CO2 27 07/20/2021   BUN 15 07/20/2021   CREATININE 0.79 07/20/2021   GFRNONAA >60 07/20/2021   CALCIUM 9.3 07/20/2021   PROT 7.3 07/20/2021   ALBUMIN 4.2 07/20/2021   LABGLOB 2.8 01/14/2016   AGRATIO 1.6 01/14/2016   BILITOT 0.9 07/20/2021   ALKPHOS 48 07/20/2021  AST 40 07/20/2021   ALT 50 (H) 07/20/2021   ANIONGAP 6 07/20/2021      Lab Results  Component Value Date   ALT 50 (H) 07/20/2021   AST 40 07/20/2021   ALKPHOS 48 07/20/2021   BILITOT 0.9 07/20/2021       Latest Ref Rng & Units 07/20/2021    4:01 PM 01/05/2021    8:17 AM 06/03/2016   11:39 AM  CBC  WBC 4.0 - 10.5 K/uL 5.9  5.8  6.9   Hemoglobin 13.0 - 17.0 g/dL 62.1  30.8  65.7   Hematocrit 39.0 - 52.0 % 43.4  43.8  44.8   Platelets 150 - 400 K/uL 179  178         Latest Ref Rng & Units 07/20/2021    4:01 PM 01/14/2016    5:26 PM 12/16/2007   10:29 PM  Hepatic Function  Total Protein 6.5 - 8.1 g/dL 7.3  7.4  6.8   Albumin 3.5 - 5.0 g/dL 4.2  4.6  4.2   AST 15 - 41 U/L 40  23  28   ALT 0 - 44 U/L 50  40  55   Alk Phosphatase 38 - 126 U/L 48  78  60   Total Bilirubin 0.3 - 1.2 mg/dL 0.9  1.3  1.2    External labs:   Cholesterol, total 213.000 m 05/10/2021 HDL 50.000 mg 05/10/2021 LDL 122.000 M 05/06/2020 Triglycerides 100.000 m 05/09/2022  A1C 4.900 % 05/10/2021 TSH 1.070 05/10/2021  Hemoglobin 15.200  g/d 07/20/2021  Creatinine, Serum 0.790 mg/ 07/20/2021 Potassium 3.500 mm 07/20/2021 Magnesium N/D ALT (SGPT) 50.000 U/L 07/20/2021  Radiology:    Cardiac Studies:   Echocardiogram 06/29/2017: 1. Left ventricle cavity is normal in size. Mild concentric hypertrophy of the left ventricle. Normal global wall motion. Normal diastolic filling pattern. Calculated EF 59%. 2. Left atrial cavity is mildly dilated at 4.1 cm. 3. Unable to estimate PA pressure due to absence/minimal TR signal. Normal tricuspid valve. 4. Trace pulmonic regurgitation. 5. IVC is dilated with poor inspiration collapse may suggest elevated right atrial pressure  Coronary calcium score 06/15/2022 Total Agatston coronary calcium score 0. MESA database percentile N/A. Visualized ascending and descending aorta normal in size, aortic atherosclerosis. Extracardiac abnormalities: 3 mm perifissural nodule right lower lobe consistent with benign intrapulmonary lymph node..  EKG:   EKG 07/13/2022: Normal sinus rhythm at the rate of 70 bpm, normal EKG.   Medications and allergies   Allergies  Allergen Reactions   Zestoretic [Lisinopril-Hydrochlorothiazide] Anxiety    Racing heart     Medication list   Current Outpatient Medications:    acetaminophen (TYLENOL) 325 MG tablet, Take 650 mg by mouth every 6 (six) hours as needed., Disp: , Rfl:    aspirin EC 325 MG tablet, Take 325 mg by mouth every 6 (six) hours as needed for moderate pain (Headache)., Disp: , Rfl:    calcium carbonate (OSCAL) 1500 (600 Ca) MG TABS tablet, Take by mouth 2 (two) times daily with a meal., Disp: , Rfl:    Coenzyme Q10 (COQ10) 200 MG CAPS, Take by mouth., Disp: , Rfl:    ibuprofen (ADVIL) 200 MG tablet, Take 200 mg by mouth every 6 (six) hours as needed., Disp: , Rfl:    Multiple Vitamins-Minerals (MULTIVITAMIN WITH MINERALS) tablet, Take 1 tablet by mouth daily., Disp: , Rfl:    Nebivolol HCl (BYSTOLIC) 20 MG TABS, Take 1 tablet (20 mg total) by  mouth every evening., Disp:  30 tablet, Rfl: 2   olmesartan (BENICAR) 40 MG tablet, Take 40 mg by mouth daily., Disp: , Rfl:    hydrochlorothiazide (HYDRODIURIL) 25 MG tablet, Take 0.5 tablets (12.5 mg total) by mouth daily., Disp: , Rfl:   Assessment     ICD-10-CM   1. Primary hypertension  I10 EKG 12-Lead    Nebivolol HCl (BYSTOLIC) 20 MG TABS    hydrochlorothiazide (HYDRODIURIL) 25 MG tablet    2. Hepatic steatosis  K76.0     3. Class 2 obesity due to excess calories without serious comorbidity with body mass index (BMI) of 37.0 to 37.9 in adult  E66.09    Z68.37        Orders Placed This Encounter  Procedures   EKG 12-Lead    Meds ordered this encounter  Medications   Nebivolol HCl (BYSTOLIC) 20 MG TABS    Sig: Take 1 tablet (20 mg total) by mouth every evening.    Dispense:  30 tablet    Refill:  2    Medications Discontinued During This Encounter  Medication Reason   albuterol (PROVENTIL HFA;VENTOLIN HFA) 108 (90 Base) MCG/ACT inhaler Completed Course   BENICAR 40 MG tablet Completed Course   benzonatate (TESSALON PERLES) 100 MG capsule Completed Course   cetirizine (ZYRTEC) 10 MG tablet Completed Course   famotidine (PEPCID) 20 MG tablet Completed Course   ipratropium (ATROVENT) 0.03 % nasal spray Completed Course   metoCLOPramide (REGLAN) 10 MG tablet Completed Course   omeprazole (PRILOSEC) 40 MG capsule Completed Course   Probiotic Product (PROBIOTIC PO) Completed Course   triamcinolone (NASACORT) 55 MCG/ACT AERO nasal inhaler Completed Course   sucralfate (CARAFATE) 1 g tablet Completed Course   hydrochlorothiazide (HYDRODIURIL) 12.5 MG tablet Dose change   aluminum-magnesium hydroxide-simethicone (MAALOX) 200-200-20 MG/5ML SUSP Completed Course   hydrochlorothiazide (HYDRODIURIL) 25 MG tablet Reorder   aspirin EC 325 MG tablet Discontinued by provider   aspirin EC 325 MG tablet Discontinued by provider     Recommendations:   Michael Peters is a 57 y.o.  Caucasian male patient with hypertension, moderate obesity referred to me for evaluation of near syncopal spells and dizziness  1. Primary hypertension Patient's symptoms of headache, palpitations, fatigue may all be related to elevated blood pressure.  Will start him on Bystolic both for anxiety and also for hypertension management, reduce the dose of hydrochlorothiazide from 25 mg to 12.5 mg daily and eventually combine Benicar and HCTZ 40/12.5 g daily.  Physical examination except for moderate obesity is completely normal so is his EKG and coronary calcium score just a month ago was 0 with no other abnormalities.  Do not suspect Lp(a) etiology or cardiac etiology for his episodes of dizziness.  He was evaluated in the emergency room recently again again his blood pressure was found to be markedly elevated.  Could consider sleep study as well in view of his obesity and hypertension.  - EKG 12-Lead - Nebivolol HCl (BYSTOLIC) 20 MG TABS; Take 1 tablet (20 mg total) by mouth every evening.  Dispense: 30 tablet; Refill: 2 - hydrochlorothiazide (HYDRODIURIL) 25 MG tablet; Take 0.5 tablets (12.5 mg total) by mouth daily.  2. Hepatic steatosis I reviewed his ultrasound of the abdomen and prior studies that were performed, he has hepatic steatosis, I have discussed with him regarding the importance of reducing his weight, risk of cirrhosis of the liver.  3. Class 2 obesity due to excess calories without serious comorbidity with body mass index (BMI) of 37.0 to  37.9 in adult Patient essentially eats out or orders out, when he does eat at home, he is really eating frozen foods.  I gave him options and discussed risk modification, exercise, joining an exercise program were discussed today.  I would like to see him back in 6 weeks for follow-up.  With regard to hypercholesterolemia, I suspect his LDL will certainly be normalized with diet and exercise and do not think he needs statin therapy for now.  His  coronary calcium score is 0.  Other orders - Multiple Vitamins-Minerals (MULTIVITAMIN WITH MINERALS) tablet; Take 1 tablet by mouth daily. - Coenzyme Q10 (COQ10) 200 MG CAPS; Take by mouth. - acetaminophen (TYLENOL) 325 MG tablet; Take 650 mg by mouth every 6 (six) hours as needed. - ibuprofen (ADVIL) 200 MG tablet; Take 200 mg by mouth every 6 (six) hours as needed. - calcium carbonate (OSCAL) 1500 (600 Ca) MG TABS tablet; Take by mouth 2 (two) times daily with a meal. - aspirin EC 325 MG tablet; Take 325 mg by mouth every 6 (six) hours as needed for moderate pain (Headache).     Michael Decamp, MD, [facility] 07/13/2022, 4:14 PM Office: [phone]

## 2022-07-15 ENCOUNTER — Telehealth: Payer: Self-pay

## 2022-07-15 NOTE — Telephone Encounter (Signed)
Bisoprolol 10 mg daily

## 2022-07-15 NOTE — Telephone Encounter (Signed)
The request for coverage for NEBIVOLOL TAB 20MG  is denied. This medicine is covered only if: You have failed or cannot use all of the following: (1) Atenolol (generic Tenormin). (2) Bisoprolol (generic Zebeta). (3) Metoprolol (generic Lopressor) Patient aware

## 2022-07-16 ENCOUNTER — Encounter: Payer: Self-pay | Admitting: Cardiology

## 2022-07-18 ENCOUNTER — Other Ambulatory Visit: Payer: Self-pay

## 2022-07-18 MED ORDER — BISOPROLOL FUMARATE 10 MG PO TABS: 10.0000 mg | ORAL_TABLET | Freq: Every day | ORAL | 1 refills | Status: DC

## 2022-07-18 NOTE — Telephone Encounter (Signed)
From pt

## 2022-07-18 NOTE — Telephone Encounter (Signed)
done

## 2022-08-23 ENCOUNTER — Encounter: Payer: Self-pay | Admitting: Cardiology

## 2022-08-23 ENCOUNTER — Ambulatory Visit: Payer: 59 | Admitting: Cardiology

## 2022-08-23 VITALS — BP 136/80 | HR 55 | Resp 16 | Ht 69.0 in | Wt 255.8 lb

## 2022-08-23 DIAGNOSIS — E6609 Other obesity due to excess calories: Secondary | ICD-10-CM

## 2022-08-23 DIAGNOSIS — I10 Essential (primary) hypertension: Secondary | ICD-10-CM

## 2022-08-23 DIAGNOSIS — R400 Somnolence: Secondary | ICD-10-CM

## 2022-08-23 DIAGNOSIS — R002 Palpitations: Secondary | ICD-10-CM

## 2022-08-23 DIAGNOSIS — R0683 Snoring: Secondary | ICD-10-CM

## 2022-08-23 MED ORDER — BISOPROLOL FUMARATE 10 MG PO TABS: 10.0000 mg | ORAL_TABLET | Freq: Every day | ORAL | 3 refills | Status: DC

## 2022-08-23 NOTE — Progress Notes (Signed)
Primary Physician/Referring:  Linus Galas, NP  Patient ID: Michael Peters, male    DOB: 10-24-1965, 57 y.o.   MRN: [id number]  Chief Complaint  Patient presents with   Hypertension   Follow-up    6 weeks   HPI:    Michael Peters  is a 57 y.o. Caucasian male patient with hypertension, moderate obesity presents for a 6 week visit of near syncopal spells and dizziness He has hypertension and obesity has risk factors.    He does not smoke, drinks occasionally and rarely.  He was evaluated for dizziness and near syncope in the emergency room on 07/07/2022 and he was markedly hypertensive.  He now presents for follow-up.  Patient also has anxiety, palpitations, markedly elevated blood pressure for which I had chosen bisoprolol.  Patient is now tolerating this, he has noticed marked improvement in anxiety levels, palpitations have essentially resolved, tolerating the medication well.  Past Medical History:  Diagnosis Date   Diverticulosis    GERD (gastroesophageal reflux disease)    Hypertension    Past Surgical History:  Procedure Laterality Date   HERNIA REPAIR     bilateral inguinal   Family History  Adopted: Yes  Problem Relation Age of Onset   Obesity Mother    Liver disease Father    Diabetes Father     Social History   Tobacco Use   Smoking status: Former    Current packs/day: 0.00    Types: Cigarettes    Start date: 1984    Quit date: 2009    Years since quitting: 15.5   Smokeless tobacco: Never  Substance Use Topics   Alcohol use: Yes    Comment: rare   Marital Status: Single  ROS  Review of Systems  Cardiovascular:  Negative for chest pain, dyspnea on exertion and leg swelling.   Objective      08/23/2022    2:31 PM 07/13/2022    3:27 PM 07/13/2022    3:12 PM  Vitals with BMI  Height 5\' 9"   5' 9.5"  Weight 255 lbs 13 oz  257 lbs  BMI 37.76  37.42  Systolic 136 174 829  Diastolic 80 100 92  Pulse 55 76 81   Blood pressure 136/80, pulse (!) 55, resp.  rate 16, height 5\' 9"  (1.753 m), weight 255 lb 12.8 oz (116 kg), SpO2 97%.  Physical Exam Constitutional:      Appearance: He is obese.  Neck:     Vascular: No carotid bruit or JVD.  Cardiovascular:     Rate and Rhythm: Normal rate and regular rhythm.     Pulses: Intact distal pulses.     Heart sounds: Normal heart sounds. No murmur heard.    No gallop.  Pulmonary:     Effort: Pulmonary effort is normal.     Breath sounds: Normal breath sounds.  Abdominal:     General: Bowel sounds are normal.     Palpations: Abdomen is soft.  Musculoskeletal:     Right lower leg: No edema.     Left lower leg: No edema.    Laboratory examination:   Lab Results  Component Value Date   GLUCOSE 133 (H) 07/20/2021   NA 138 07/20/2021   K 3.5 07/20/2021   CL 105 07/20/2021   CO2 27 07/20/2021   BUN 15 07/20/2021   CREATININE 0.79 07/20/2021   GFRNONAA >60 07/20/2021   CALCIUM 9.3 07/20/2021   PROT 7.3 07/20/2021   ALBUMIN 4.2 07/20/2021  LABGLOB 2.8 01/14/2016   AGRATIO 1.6 01/14/2016   BILITOT 0.9 07/20/2021   ALKPHOS 48 07/20/2021   AST 40 07/20/2021   ALT 50 (H) 07/20/2021   ANIONGAP 6 07/20/2021      Lab Results  Component Value Date   ALT 50 (H) 07/20/2021   AST 40 07/20/2021   ALKPHOS 48 07/20/2021   BILITOT 0.9 07/20/2021       Latest Ref Rng & Units 07/20/2021    4:01 PM 01/05/2021    8:17 AM 06/03/2016   11:39 AM  CBC  WBC 4.0 - 10.5 K/uL 5.9  5.8  6.9   Hemoglobin 13.0 - 17.0 g/dL 03.4  74.2  59.5   Hematocrit 39.0 - 52.0 % 43.4  43.8  44.8   Platelets 150 - 400 K/uL 179  178         Latest Ref Rng & Units 07/20/2021    4:01 PM 01/14/2016    5:26 PM 12/16/2007   10:29 PM  Hepatic Function  Total Protein 6.5 - 8.1 g/dL 7.3  7.4  6.8   Albumin 3.5 - 5.0 g/dL 4.2  4.6  4.2   AST 15 - 41 U/L 40  23  28   ALT 0 - 44 U/L 50  40  55   Alk Phosphatase 38 - 126 U/L 48  78  60   Total Bilirubin 0.3 - 1.2 mg/dL 0.9  1.3  1.2    External labs:   Cholesterol,  total 213.000 m 05/10/2021 HDL 50.000 mg 05/10/2021 LDL 122.000 M 05/06/2020 Triglycerides 100.000 m 05/09/2022  A1C 4.900 % 05/10/2021 TSH 1.070 05/10/2021  Hemoglobin 15.200 g/d 07/20/2021  Creatinine, Serum 0.790 mg/ 07/20/2021 Potassium 3.500 mm 07/20/2021 Magnesium N/D ALT (SGPT) 50.000 U/L 07/20/2021  Radiology:    Cardiac Studies:   Echocardiogram 06/29/2017: 1. Left ventricle cavity is normal in size. Mild concentric hypertrophy of the left ventricle. Normal global wall motion. Normal diastolic filling pattern. Calculated EF 59%. 2. Left atrial cavity is mildly dilated at 4.1 cm. 3. Unable to estimate PA pressure due to absence/minimal TR signal. Normal tricuspid valve. 4. Trace pulmonic regurgitation. 5. IVC is dilated with poor inspiration collapse may suggest elevated right atrial pressure  Coronary calcium score 06/15/2022 Total Agatston coronary calcium score 0. MESA database percentile N/A. Visualized ascending and descending aorta normal in size, aortic atherosclerosis. Extracardiac abnormalities: 3 mm perifissural nodule right lower lobe consistent with benign intrapulmonary lymph node..  EKG:   EKG 07/13/2022: Normal sinus rhythm at the rate of 70 bpm, normal EKG.   Medications and allergies   Allergies  Allergen Reactions   Zestoretic [Lisinopril-Hydrochlorothiazide] Anxiety    Racing heart     Medication list   Current Outpatient Medications:    acetaminophen (TYLENOL) 325 MG tablet, Take 650 mg by mouth every 6 (six) hours as needed., Disp: , Rfl:    aspirin EC 325 MG tablet, Take 325 mg by mouth every 6 (six) hours as needed for moderate pain (Headache)., Disp: , Rfl:    calcium carbonate (OSCAL) 1500 (600 Ca) MG TABS tablet, Take 1,500 mg by mouth daily as needed (Heart burn)., Disp: , Rfl:    Coenzyme Q10 (COQ10) 200 MG CAPS, Take by mouth., Disp: , Rfl:    hydrochlorothiazide (HYDRODIURIL) 25 MG tablet, Take 12.5 mg by mouth every evening., Disp: , Rfl:     ibuprofen (ADVIL) 200 MG tablet, Take 200 mg by mouth every 6 (six) hours as needed., Disp: , Rfl:  olmesartan (BENICAR) 40 MG tablet, Take 40 mg by mouth every morning., Disp: , Rfl:    bisoprolol (ZEBETA) 10 MG tablet, Take 1 tablet (10 mg total) by mouth daily., Disp: 90 tablet, Rfl: 3   Multiple Vitamins-Minerals (MULTIVITAMIN WITH MINERALS) tablet, Take 1 tablet by mouth daily., Disp: , Rfl:   Assessment     ICD-10-CM   1. Primary hypertension  I10 Ambulatory referral to Sleep Studies    bisoprolol (ZEBETA) 10 MG tablet    2. Palpitations  R00.2 bisoprolol (ZEBETA) 10 MG tablet    3. Daytime somnolence  R40.0 Ambulatory referral to Sleep Studies    4. Snoring  R06.83 Ambulatory referral to Sleep Studies    5. Class 2 obesity due to excess calories without serious comorbidity with body mass index (BMI) of 37.0 to 37.9 in adult  E66.09 Ambulatory referral to Sleep Studies   Z68.37        Orders Placed This Encounter  Procedures   Ambulatory referral to Sleep Studies    Referral Priority:   Routine    Referral Type:   Consultation    Referral Reason:   Specialty Services Required    Number of Visits Requested:   1   Meds ordered this encounter  Medications   bisoprolol (ZEBETA) 10 MG tablet    Sig: Take 1 tablet (10 mg total) by mouth daily.    Dispense:  90 tablet    Refill:  3    D/C BYSTOLIC IT IS NOT COVERED   Medications Discontinued During This Encounter  Medication Reason   bisoprolol (ZEBETA) 10 MG tablet Reorder      Recommendations:   Michael Peters is a 57 y.o. Caucasian male patient with hypertension, moderate obesity presents for a 6 week visit of near syncopal spells and dizziness  1. Primary hypertension Patient's symptoms of near syncopal spells and also dizziness and palpitations and anxiety have not significantly improved since being on bisoprolol 10 mg daily, although blood pressure is still slightly elevated, will continue with present dose of  the medication and he is very motivated in weight loss and is awaiting to join [facility] after his vacation in 3 weeks.  - Ambulatory referral to Sleep Studies - bisoprolol (ZEBETA) 10 MG tablet; Take 1 tablet (10 mg total) by mouth daily.  Dispense: 90 tablet; Refill: 3  2. Palpitations Symptoms of palpitations have essentially resolved since being on beta-blocker therapy. - bisoprolol (ZEBETA) 10 MG tablet; Take 1 tablet (10 mg total) by mouth daily.  Dispense: 90 tablet; Refill: 3  3. Daytime somnolence He does have daytime somnolence and snoring, moderate obesity, hypertension, all these are risk factors for significant sleep apnea, he is only 57 years of age, I have made a referral for sleep study.   - Ambulatory referral to Sleep Studies  4. Snoring  - Ambulatory referral to Sleep Studies  5. Class 2 obesity due to excess calories without serious comorbidity with body mass index (BMI) of 37.0 to 37.9 in adult Patient now joining weight loss clinic after his vacation in 3 weeks, patient is aware of hepatic steatosis.  I will see him back on a PRN basis, weight loss will help with lipid control. Coronary calcium score is 0.  - Ambulatory referral to Sleep Studies    Yates Decamp, MD, [facility] 08/23/2022, 3:19 PM Office: [phone]

## 2022-08-24 ENCOUNTER — Ambulatory Visit: Payer: 59 | Admitting: Cardiology

## 2022-09-12 ENCOUNTER — Telehealth: Payer: 59 | Admitting: Physician Assistant

## 2022-09-12 DIAGNOSIS — U071 COVID-19: Secondary | ICD-10-CM

## 2022-09-12 MED ORDER — FLUTICASONE PROPIONATE 50 MCG/ACT NA SUSP
2.0000 | Freq: Every day | NASAL | 0 refills | Status: AC
Start: 2022-09-12 — End: ?

## 2022-09-12 MED ORDER — NIRMATRELVIR/RITONAVIR (PAXLOVID)TABLET
3.0000 | ORAL_TABLET | Freq: Two times a day (BID) | ORAL | 0 refills | Status: AC
Start: 2022-09-12 — End: 2022-09-17

## 2022-09-12 NOTE — Patient Instructions (Signed)
Sharion Dove, thank you for joining [name], PA-C for today's virtual visit.  While this provider is not your primary care provider (PCP), if your PCP is located in our provider database this encounter information will be shared with them immediately following your visit.   A [facility] MyChart account gives you access to today's visit and all your visits, tests, and labs performed at [facility] " click here if you don't have a [facility] MyChart account or go to [email or web address]  Consent: (Patient) Michael Peters provided verbal consent for this virtual visit at the beginning of the encounter.  Current Medications:  Current Outpatient Medications:    fluticasone (FLONASE) 50 MCG/ACT nasal spray, Place 2 sprays into both nostrils daily., Disp: 16 g, Rfl: 0   nirmatrelvir/ritonavir (PAXLOVID) 20 x 150 MG & 10 x 100MG  TABS, Take 3 tablets by mouth 2 (two) times daily for 5 days. (Take nirmatrelvir 150 mg two tablets twice daily for 5 days and ritonavir 100 mg one tablet twice daily for 5 days) Patient GFR is greater than 60, Disp: 30 tablet, Rfl: 0   acetaminophen (TYLENOL) 325 MG tablet, Take 650 mg by mouth every 6 (six) hours as needed., Disp: , Rfl:    aspirin EC 325 MG tablet, Take 325 mg by mouth every 6 (six) hours as needed for moderate pain (Headache)., Disp: , Rfl:    bisoprolol (ZEBETA) 10 MG tablet, Take 1 tablet (10 mg total) by mouth daily., Disp: 90 tablet, Rfl: 3   calcium carbonate (OSCAL) 1500 (600 Ca) MG TABS tablet, Take 1,500 mg by mouth daily as needed (Heart burn)., Disp: , Rfl:    Coenzyme Q10 (COQ10) 200 MG CAPS, Take by mouth., Disp: , Rfl:    hydrochlorothiazide (HYDRODIURIL) 25 MG tablet, Take 12.5 mg by mouth every evening., Disp: , Rfl:    ibuprofen (ADVIL) 200 MG tablet, Take 200 mg by mouth every 6 (six) hours as needed., Disp: , Rfl:    Multiple Vitamins-Minerals (MULTIVITAMIN WITH MINERALS) tablet, Take 1 tablet by mouth daily.,  Disp: , Rfl:    olmesartan (BENICAR) 40 MG tablet, Take 40 mg by mouth every morning., Disp: , Rfl:    Medications ordered in this encounter:  Meds ordered this encounter  Medications   nirmatrelvir/ritonavir (PAXLOVID) 20 x 150 MG & 10 x 100MG  TABS    Sig: Take 3 tablets by mouth 2 (two) times daily for 5 days. (Take nirmatrelvir 150 mg two tablets twice daily for 5 days and ritonavir 100 mg one tablet twice daily for 5 days) Patient GFR is greater than 60    Dispense:  30 tablet    Refill:  0    Order Specific Question:   Supervising Provider    Answer:   Merrilee Jansky [[id number]]   fluticasone (FLONASE) 50 MCG/ACT nasal spray    Sig: Place 2 sprays into both nostrils daily.    Dispense:  16 g    Refill:  0    Order Specific Question:   Supervising Provider    Answer:   Merrilee Jansky [id number]     *If you need refills on other medications prior to your next appointment, please contact your pharmacy*  Follow-Up: Call back or seek an in-person evaluation if the symptoms worsen or if the condition fails to improve as anticipated.  [facility] [phone]  Care Instructions: Can take to lessen severity: Vit C 500mg  twice daily Quercertin 250-500mg  twice daily Zinc 75-100mg   daily Melatonin 3-6 mg at bedtime Vit D3 1000-2000 IU daily Aspirin 81 mg daily with food Optional: Famotidine 20mg  daily Also can add tylenol/ibuprofen as needed for fevers and body aches May add Mucinex or Mucinex DM as needed for cough/congestion    Isolation Instructions: You are to isolate at home until you have been fever free for at least 24 hours without a fever-reducing medication, and symptoms have been steadily improving for 24 hours. At that time,  you can end isolation but need to mask for an additional 5 days.   If you must be around other household members who do not have symptoms, you need to make sure that both you and the family members are masking consistently with a  high-quality mask.  If you note any worsening of symptoms despite treatment, please seek an in-person evaluation ASAP. If you note any significant shortness of breath or any chest pain, please seek ER evaluation. Please do not delay care!   COVID-19: What to Do if You Are Sick If you test positive and are an older adult or someone who is at high risk of getting very sick from COVID-19, treatment may be available. Contact a healthcare provider right away after a positive test to determine if you are eligible, even if your symptoms are mild right now. You can also visit a Test to Treat location and, if eligible, receive a prescription from a provider. Don't delay: Treatment must be started within the first few days to be effective. If you have a fever, cough, or other symptoms, you might have COVID-19. Most people have mild illness and are able to recover at home. If you are sick: Keep track of your symptoms. If you have an emergency warning sign (including trouble breathing), call 911. Steps to help prevent the spread of COVID-19 if you are sick If you are sick with COVID-19 or think you might have COVID-19, follow the steps below to care for yourself and to help protect other people in your home and community. Stay home except to get medical care Stay home. Most people with COVID-19 have mild illness and can recover at home without medical care. Do not leave your home, except to get medical care. Do not visit public areas and do not go to places where you are unable to wear a mask. Take care of yourself. Get rest and stay hydrated. Take over-the-counter medicines, such as acetaminophen, to help you feel better. Stay in touch with your doctor. Call before you get medical care. Be sure to get care if you have trouble breathing, or have any other emergency warning signs, or if you think it is an emergency. Avoid public transportation, ride-sharing, or taxis if possible. Get tested If you have symptoms  of COVID-19, get tested. While waiting for test results, stay away from others, including staying apart from those living in your household. Get tested as soon as possible after your symptoms start. Treatments may be available for people with COVID-19 who are at risk for becoming very sick. Don't delay: Treatment must be started early to be effective--some treatments must begin within 5 days of your first symptoms. Contact your healthcare provider right away if your test result is positive to determine if you are eligible. Self-tests are one of several options for testing for the virus that causes COVID-19 and may be more convenient than laboratory-based tests and point-of-care tests. Ask your healthcare provider or your local health department if you need help interpreting your test results.  You can visit your state, tribal, local, and territorial health department's website to look for the latest local information on testing sites. Separate yourself from other people As much as possible, stay in a specific room and away from other people and pets in your home. If possible, you should use a separate bathroom. If you need to be around other people or animals in or outside of the home, wear a well-fitting mask. Tell your close contacts that they may have been exposed to COVID-19. An infected person can spread COVID-19 starting 48 hours (or 2 days) before the person has any symptoms or tests positive. By letting your close contacts know they may have been exposed to COVID-19, you are helping to protect everyone. See COVID-19 and Animals if you have questions about pets. If you are diagnosed with COVID-19, someone from the health department may call you. Answer the call to slow the spread. Monitor your symptoms Symptoms of COVID-19 include fever, cough, or other symptoms. Follow care instructions from your healthcare provider and local health department. Your local health authorities may give instructions on  checking your symptoms and reporting information. When to seek emergency medical attention Look for emergency warning signs* for COVID-19. If someone is showing any of these signs, seek emergency medical care immediately: Trouble breathing Persistent pain or pressure in the chest New confusion Inability to wake or stay awake Pale, gray, or blue-colored skin, lips, or nail beds, depending on skin tone *This list is not all possible symptoms. Please call your medical provider for any other symptoms that are severe or concerning to you. Call 911 or call ahead to your local emergency facility: Notify the operator that you are seeking care for someone who has or may have COVID-19. Call ahead before visiting your doctor Call ahead. Many medical visits for routine care are being postponed or done by phone or telemedicine. If you have a medical appointment that cannot be postponed, call your doctor's office, and tell them you have or may have COVID-19. This will help the office protect themselves and other patients. If you are sick, wear a well-fitting mask You should wear a mask if you must be around other people or animals, including pets (even at home). Wear a mask with the best fit, protection, and comfort for you. You don't need to wear the mask if you are alone. If you can't put on a mask (because of trouble breathing, for example), cover your coughs and sneezes in some other way. Try to stay at least 6 feet away from other people. This will help protect the people around you. Masks should not be placed on young children under age 27 years, anyone who has trouble breathing, or anyone who is not able to remove the mask without help. Cover your coughs and sneezes Cover your mouth and nose with a tissue when you cough or sneeze. Throw away used tissues in a lined trash can. Immediately wash your hands with soap and water for at least 20 seconds. If soap and water are not available, clean your hands  with an alcohol-based hand sanitizer that contains at least 60% alcohol. Clean your hands often Wash your hands often with soap and water for at least 20 seconds. This is especially important after blowing your nose, coughing, or sneezing; going to the bathroom; and before eating or preparing food. Use hand sanitizer if soap and water are not available. Use an alcohol-based hand sanitizer with at least 60% alcohol, covering all surfaces of  your hands and rubbing them together until they feel dry. Soap and water are the best option, especially if hands are visibly dirty. Avoid touching your eyes, nose, and mouth with unwashed hands. Handwashing Tips Avoid sharing personal household items Do not share dishes, drinking glasses, cups, eating utensils, towels, or bedding with other people in your home. Wash these items thoroughly after using them with soap and water or put in the dishwasher. Clean surfaces in your home regularly Clean and disinfect high-touch surfaces (for example, doorknobs, tables, handles, light switches, and countertops) in your "sick room" and bathroom. In shared spaces, you should clean and disinfect surfaces and items after each use by the person who is ill. If you are sick and cannot clean, a caregiver or other person should only clean and disinfect the area around you (such as your bedroom and bathroom) on an as needed basis. Your caregiver/other person should wait as long as possible (at least several hours) and wear a mask before entering, cleaning, and disinfecting shared spaces that you use. Clean and disinfect areas that may have blood, stool, or body fluids on them. Use household cleaners and disinfectants. Clean visible dirty surfaces with household cleaners containing soap or detergent. Then, use a household disinfectant. Use a product from [employer] List N: Disinfectants for Coronavirus (COVID-19). Be sure to follow the instructions on the label to ensure safe and effective  use of the product. Many products recommend keeping the surface wet with a disinfectant for a certain period of time (look at "contact time" on the product label). You may also need to wear personal protective equipment, such as gloves, depending on the directions on the product label. Immediately after disinfecting, wash your hands with soap and water for 20 seconds. For completed guidance on cleaning and disinfecting your home, visit Complete Disinfection Guidance. Take steps to improve ventilation at home Improve ventilation (air flow) at home to help prevent from spreading COVID-19 to other people in your household. Clear out COVID-19 virus particles in the air by opening windows, using air filters, and turning on fans in your home. Use this interactive tool to learn how to improve air flow in your home. When you can be around others after being sick with COVID-19 Deciding when you can be around others is different for different situations. Find out when you can safely end home isolation. For any additional questions about your care, contact your healthcare provider or state or local health department. 04/14/2020 Content source: [facility] ([facility]), Division of Viral Diseases This information is not intended to replace advice given to you by your health care provider. Make sure you discuss any questions you have with your health care provider. Document Revised: 05/28/2020 Document Reviewed: 05/28/2020 Elsevier Patient Education  2022 [employer].     If you have been instructed to have an in-person evaluation today at a local Urgent Care facility, please use the link below. It will take you to a list of all of our available [facility], including address, phone number and hours of operation. Please do not delay care.  [facility]  If you or a family member do not have a primary care provider, use the link below to  schedule a visit and establish care. When you choose a [facility] primary care physician or advanced practice provider, you gain a long-term partner in health. Find a Primary Care Provider  Learn more about [facility]'s in-office and virtual  care options: [facility] - Get Care Now

## 2022-09-12 NOTE — Progress Notes (Signed)
Virtual Visit Consent   [name], you are scheduled for a virtual visit with a [facility] provider today. Just as with appointments in the office, your consent must be obtained to participate. Your consent will be active for this visit and any virtual visit you may have with one of our providers in the next 365 days. If you have a MyChart account, a copy of this consent can be sent to you electronically.  As this is a virtual visit, video technology does not allow for your provider to perform a traditional examination. This may limit your provider's ability to fully assess your condition. If your provider identifies any concerns that need to be evaluated in person or the need to arrange testing (such as labs, EKG, etc.), we will make arrangements to do so. Although advances in technology are sophisticated, we cannot ensure that it will always work on either your end or our end. If the connection with a video visit is poor, the visit may have to be switched to a telephone visit. With either a video or telephone visit, we are not always able to ensure that we have a secure connection.  By engaging in this virtual visit, you consent to the provision of healthcare and authorize for your insurance to be billed (if applicable) for the services provided during this visit. Depending on your insurance coverage, you may receive a charge related to this service.  I need to obtain your verbal consent now. Are you willing to proceed with your visit today? Michael Peters has provided verbal consent on 09/12/2022 for a virtual visit (video or telephone). [name], PA-C  Date: 09/12/2022 8:31 AM  Virtual Visit via Video Note   I, [name], connected with  Michael Peters  ([id number], January 30, 1965) on 09/12/22 at  8:15 AM EDT by a video-enabled telemedicine application and verified that I am speaking with the correct person using two identifiers.  Location: Patient: Virtual Visit Location Patient:  Home Provider: Virtual Visit Location Provider: Home Office   I discussed the limitations of evaluation and management by telemedicine and the availability of in person appointments. The patient expressed understanding and agreed to proceed.    History of Present Illness: Michael Peters is a 57 y.o. who identifies as a male who was assigned male at birth, and is being seen today for Covid 92.  HPI: URI  This is a new problem. The current episode started in the past 7 days (Scratchy throat started about 6 days ago, last Weds (09/07/22); Symptoms worsened on Saturday, 09/10/22, so he tested for covid and he tested negative, then continued to feel worse on Sunday so retested and was positive). The problem has been gradually worsening. There has been no fever. Associated symptoms include congestion, coughing, headaches, sinus pain and a sore throat (scratchy). Pertinent negatives include no diarrhea, ear pain, nausea, plugged ear sensation, rhinorrhea or vomiting. Associated symptoms comments: Equilibrium feels off. He has tried antihistamine (zycam, sudafed nasal spray) for the symptoms. The treatment provided no relief.     Problems:  Patient Active Problem List   Diagnosis Date Noted   Acute pain of right knee 06/23/2016   Sprain of right knee 06/23/2016    Allergies:  Allergies  Allergen Reactions   Zestoretic [Lisinopril-Hydrochlorothiazide] Anxiety    Racing heart   Medications:  Current Outpatient Medications:    fluticasone (FLONASE) 50 MCG/ACT nasal spray, Place 2 sprays into both nostrils daily., Disp: 16 g, Rfl: 0   nirmatrelvir/ritonavir (  PAXLOVID) 20 x 150 MG & 10 x 100MG  TABS, Take 3 tablets by mouth 2 (two) times daily for 5 days. (Take nirmatrelvir 150 mg two tablets twice daily for 5 days and ritonavir 100 mg one tablet twice daily for 5 days) Patient GFR is greater than 60, Disp: 30 tablet, Rfl: 0   acetaminophen (TYLENOL) 325 MG tablet, Take 650 mg by mouth every 6 (six) hours  as needed., Disp: , Rfl:    aspirin EC 325 MG tablet, Take 325 mg by mouth every 6 (six) hours as needed for moderate pain (Headache)., Disp: , Rfl:    bisoprolol (ZEBETA) 10 MG tablet, Take 1 tablet (10 mg total) by mouth daily., Disp: 90 tablet, Rfl: 3   calcium carbonate (OSCAL) 1500 (600 Ca) MG TABS tablet, Take 1,500 mg by mouth daily as needed (Heart burn)., Disp: , Rfl:    Coenzyme Q10 (COQ10) 200 MG CAPS, Take by mouth., Disp: , Rfl:    hydrochlorothiazide (HYDRODIURIL) 25 MG tablet, Take 12.5 mg by mouth every evening., Disp: , Rfl:    ibuprofen (ADVIL) 200 MG tablet, Take 200 mg by mouth every 6 (six) hours as needed., Disp: , Rfl:    Multiple Vitamins-Minerals (MULTIVITAMIN WITH MINERALS) tablet, Take 1 tablet by mouth daily., Disp: , Rfl:    olmesartan (BENICAR) 40 MG tablet, Take 40 mg by mouth every morning., Disp: , Rfl:   Observations/Objective: Patient is well-developed, well-nourished in no acute distress.  Resting comfortably at home.  Head is normocephalic, atraumatic.  No labored breathing.  Speech is clear and coherent with logical content.  Patient is alert and oriented at baseline.    Assessment and Plan: 1. COVID-19 - MyChart COVID-19 home monitoring program; Future - nirmatrelvir/ritonavir (PAXLOVID) 20 x 150 MG & 10 x 100MG  TABS; Take 3 tablets by mouth 2 (two) times daily for 5 days. (Take nirmatrelvir 150 mg two tablets twice daily for 5 days and ritonavir 100 mg one tablet twice daily for 5 days) Patient GFR is greater than 60  Dispense: 30 tablet; Refill: 0 - fluticasone (FLONASE) 50 MCG/ACT nasal spray; Place 2 sprays into both nostrils daily.  Dispense: 16 g; Refill: 0  - Continue OTC symptomatic management of choice - Will send OTC vitamins and supplement information through AVS - Paxlovid and Flonase prescribed - Discussed potential of kidney and liver damage with no recent labs. Since he had labs last year without indication of kidney injury and has had  Paxlovid in the past without issue, it was decided to proceed with prescribing - Patient enrolled in MyChart symptom monitoring - Push fluids - Rest as needed - Discussed return precautions and when to seek in-person evaluation, sent via AVS as well   Follow Up Instructions: I discussed the assessment and treatment plan with the patient. The patient was provided an opportunity to ask questions and all were answered. The patient agreed with the plan and demonstrated an understanding of the instructions.  A copy of instructions were sent to the patient via MyChart unless otherwise noted below.    The patient was advised to call back or seek an in-person evaluation if the symptoms worsen or if the condition fails to improve as anticipated.  Time:  I spent 10 minutes with the patient via telehealth technology discussing the above problems/concerns.    [name], PA-C

## 2022-10-23 ENCOUNTER — Encounter: Payer: Self-pay | Admitting: Internal Medicine

## 2023-06-01 ENCOUNTER — Encounter (INDEPENDENT_AMBULATORY_CARE_PROVIDER_SITE_OTHER): Payer: Self-pay | Admitting: Otolaryngology

## 2023-08-24 ENCOUNTER — Encounter (INDEPENDENT_AMBULATORY_CARE_PROVIDER_SITE_OTHER): Payer: Self-pay | Admitting: Otolaryngology

## 2023-08-24 ENCOUNTER — Ambulatory Visit (INDEPENDENT_AMBULATORY_CARE_PROVIDER_SITE_OTHER): Admitting: Otolaryngology

## 2023-08-24 VITALS — BP 156/84 | HR 56 | Ht 69.0 in | Wt 257.0 lb

## 2023-08-24 DIAGNOSIS — H93A2 Pulsatile tinnitus, left ear: Secondary | ICD-10-CM

## 2023-08-24 DIAGNOSIS — H9313 Tinnitus, bilateral: Secondary | ICD-10-CM

## 2023-08-24 DIAGNOSIS — J342 Deviated nasal septum: Secondary | ICD-10-CM

## 2023-08-24 DIAGNOSIS — H93A3 Pulsatile tinnitus, bilateral: Secondary | ICD-10-CM

## 2023-08-24 DIAGNOSIS — J343 Hypertrophy of nasal turbinates: Secondary | ICD-10-CM

## 2023-08-24 DIAGNOSIS — J310 Chronic rhinitis: Secondary | ICD-10-CM | POA: Diagnosis not present

## 2023-08-24 MED ORDER — FLUTICASONE PROPIONATE 50 MCG/ACT NA SUSP: 2.0000 | Freq: Every day | NASAL | 10 refills | Status: AC

## 2023-08-26 DIAGNOSIS — J310 Chronic rhinitis: Secondary | ICD-10-CM | POA: Insufficient documentation

## 2023-08-26 DIAGNOSIS — J342 Deviated nasal septum: Secondary | ICD-10-CM | POA: Insufficient documentation

## 2023-08-26 DIAGNOSIS — J343 Hypertrophy of nasal turbinates: Secondary | ICD-10-CM | POA: Insufficient documentation

## 2023-08-26 DIAGNOSIS — H9313 Tinnitus, bilateral: Secondary | ICD-10-CM | POA: Insufficient documentation

## 2023-08-26 DIAGNOSIS — H93A2 Pulsatile tinnitus, left ear: Secondary | ICD-10-CM | POA: Insufficient documentation

## 2023-08-26 NOTE — Progress Notes (Signed)
 CC: Chronic nasal obstruction, pulsatile tinnitus  HPI:  Michael Peters is a 58 y.o. male who presents today complaining of chronic nasal obstruction for 10+ years.  He also complains of bilateral pulsatile tinnitus, worse on the left side.  He was previously diagnosed with nasal septal deviation.  He has a history of environmental allergies.  He uses OTC allergy medications and Flonase  nasal spray intermittently over the past few years.  He has no previous ENT surgery.  He was diagnosed with 1 episode of sinusitis last year.  Currently he denies any otalgia, otorrhea, hearing difficulty, facial pain, or fever.  Past Medical History:  Diagnosis Date   Diverticulosis    GERD (gastroesophageal reflux disease)    Hypertension     Past Surgical History:  Procedure Laterality Date   HERNIA REPAIR     bilateral inguinal    Family History  Adopted: Yes  Problem Relation Age of Onset   Obesity Mother    Liver disease Father    Diabetes Father     Social History:  reports that he quit smoking about 16 years ago. His smoking use included cigarettes. He started smoking about 41 years ago. He has never used smokeless tobacco. He reports current alcohol use. He reports that he does not use drugs.  Allergies:  Allergies  Allergen Reactions   Zestoretic [Lisinopril-Hydrochlorothiazide] Anxiety    Racing heart    Prior to Admission medications   Medication Sig Start Date End Date Taking? Authorizing Provider  acetaminophen (TYLENOL) 325 MG tablet Take 650 mg by mouth every 6 (six) hours as needed.   Yes [provider]  aspirin EC 325 MG tablet Take 325 mg by mouth every 6 (six) hours as needed for moderate pain (Headache).   Yes [provider]  bisoprolol  (ZEBETA ) 10 MG tablet Take 1 tablet (10 mg total) by mouth daily. 08/23/22  Yes Ladona Heinz, MD  calcium carbonate (OSCAL) 1500 (600 Ca) MG TABS tablet Take 1,500 mg by mouth daily as needed (Heart burn).   Yes [provider]  Coenzyme Q10 (COQ10) 200 MG CAPS Take by mouth.   Yes [provider]  fluticasone  (FLONASE ) 50 MCG/ACT nasal spray Place 2 sprays into both nostrils daily. 09/12/22  Yes Vivienne Delon HERO, PA-C  fluticasone  (FLONASE ) 50 MCG/ACT nasal spray Place 2 sprays into both nostrils daily. 08/24/23 09/23/23 Yes Karis Clunes, MD  hydrochlorothiazide (HYDRODIURIL) 25 MG tablet Take 12.5 mg by mouth every evening. 07/13/22  Yes Ladona Heinz, MD  ibuprofen (ADVIL) 200 MG tablet Take 200 mg by mouth every 6 (six) hours as needed.   Yes [provider]  Multiple Vitamins-Minerals (MULTIVITAMIN WITH MINERALS) tablet Take 1 tablet by mouth daily.   Yes [provider]  olmesartan (BENICAR) 40 MG tablet Take 40 mg by mouth every morning.   Yes [provider]    Blood pressure (!) 156/84, pulse (!) 56, height 5' 9 (1.753 m), weight 257 lb (116.6 kg), SpO2 94%. Exam: General: Communicates without difficulty, well nourished, no acute distress. Head: Normocephalic, no evidence injury, no tenderness, facial buttresses intact without stepoff. Face/sinus: No tenderness to palpation and percussion. Facial movement is normal and symmetric. Eyes: PERRL, EOMI. No scleral icterus, conjunctivae clear. Neuro: CN II exam reveals vision grossly intact.  No nystagmus at any point of gaze. Ears: Auricles well formed without lesions.  Ear canals are intact without mass or lesion.  No erythema or edema is appreciated.  The TMs are intact  without fluid. Nose: External evaluation reveals normal support and skin without lesions.  Dorsum is intact.  Anterior rhinoscopy reveals congested mucosa over anterior aspect of inferior turbinates and deviated septum.  No purulence noted. Oral:  Oral cavity and oropharynx are intact, symmetric, without erythema or edema.  Mucosa is moist without lesions. Neck: Full range of motion without pain.  There is no significant lymphadenopathy.  No masses palpable.   Thyroid  bed within normal limits to palpation.  Parotid glands and submandibular glands equal bilaterally without mass.  Trachea is midline. Neuro:  CN 2-12 grossly intact.   Procedure:  Flexible Nasal Endoscopy: Description: Risks, benefits, and alternatives of flexible endoscopy were explained to the patient.  Specific mention was made of the risk of throat numbness with difficulty swallowing, possible bleeding from the nose and mouth, and pain from the procedure.  The patient gave oral consent to proceed.  The flexible scope was inserted into the right nasal cavity.  Endoscopy of the interior nasal cavity, superior, inferior, and middle meatus was performed. The sphenoid-ethmoid recess was examined. Edematous mucosa was noted.  No polyp, mass, or lesion was appreciated. Nasal septal deviation noted. Olfactory cleft was clear.  Nasopharynx was clear.  Turbinates were hypertrophied but without mass.  The procedure was repeated on the contralateral side with similar findings.  The patient tolerated the procedure well.   Assessment: 1.  Chronic rhinitis with nasal mucosa, nasal septal deviation, and bilateral turbinate hypertrophy.  More than 95% of the nasal passageways are obstructed bilaterally. 2.  No polyps, mass, lesion, or purulent drainage is noted today. 3.  Bilateral pulsatile tinnitus, worse on the left side.  His ear canals, tympanic membranes, and middle ear spaces are all normal.  The possible differential diagnoses include vascular tumor, AV malformation, hyperdynamic state, aneurysm, sinus thrombosis, or other idiopathic causes.    Plan: 1.  The physical exam and nasal endoscopy findings are reviewed with the patient. 2.  Flonase  nasal spray 2 sprays each nostril daily.  The importance of consistent daily use is discussed. 3.  Nasal saline irrigation daily. 4.  The possible causes of his pulsatile tinnitus are discussed.   5.  MRI scan/CT angiography to evaluate for possible causes of  his pulsatile tinnitus. 6.  The patient will return for reevaluation in 1 month.  Vesper Trant W Aashi Derrington 08/26/2023, 9:47 AM

## 2023-08-28 ENCOUNTER — Encounter (INDEPENDENT_AMBULATORY_CARE_PROVIDER_SITE_OTHER): Payer: Self-pay

## 2023-09-08 ENCOUNTER — Other Ambulatory Visit: Payer: Self-pay | Admitting: Cardiology

## 2023-09-08 DIAGNOSIS — I10 Essential (primary) hypertension: Secondary | ICD-10-CM

## 2023-09-08 DIAGNOSIS — R002 Palpitations: Secondary | ICD-10-CM

## 2023-09-20 ENCOUNTER — Other Ambulatory Visit (INDEPENDENT_AMBULATORY_CARE_PROVIDER_SITE_OTHER): Payer: Self-pay | Admitting: Otolaryngology

## 2023-09-20 DIAGNOSIS — H93A2 Pulsatile tinnitus, left ear: Secondary | ICD-10-CM

## 2023-09-21 ENCOUNTER — Encounter (INDEPENDENT_AMBULATORY_CARE_PROVIDER_SITE_OTHER): Payer: Self-pay

## 2023-09-21 ENCOUNTER — Telehealth (INDEPENDENT_AMBULATORY_CARE_PROVIDER_SITE_OTHER): Payer: Self-pay | Admitting: Otolaryngology

## 2023-09-21 NOTE — Telephone Encounter (Signed)
 Per Dr.Teoh, I told patient that his brain MRI was normal. The next step is a head CT angiogram. Patient understood. Patient will f/u on 10/09/23 for his chronic rhinitis even with out his  CT angiogram result.

## 2023-10-02 ENCOUNTER — Encounter (INDEPENDENT_AMBULATORY_CARE_PROVIDER_SITE_OTHER): Payer: Self-pay

## 2023-10-03 ENCOUNTER — Other Ambulatory Visit (INDEPENDENT_AMBULATORY_CARE_PROVIDER_SITE_OTHER): Payer: Self-pay | Admitting: Otolaryngology

## 2023-10-03 ENCOUNTER — Encounter (INDEPENDENT_AMBULATORY_CARE_PROVIDER_SITE_OTHER): Payer: Self-pay

## 2023-10-03 DIAGNOSIS — H93A2 Pulsatile tinnitus, left ear: Secondary | ICD-10-CM

## 2023-10-04 ENCOUNTER — Encounter (INDEPENDENT_AMBULATORY_CARE_PROVIDER_SITE_OTHER): Payer: Self-pay

## 2023-10-06 ENCOUNTER — Other Ambulatory Visit: Payer: Self-pay | Admitting: Medical Genetics

## 2023-10-09 ENCOUNTER — Encounter (INDEPENDENT_AMBULATORY_CARE_PROVIDER_SITE_OTHER): Payer: Self-pay | Admitting: Otolaryngology

## 2023-10-09 ENCOUNTER — Ambulatory Visit (INDEPENDENT_AMBULATORY_CARE_PROVIDER_SITE_OTHER): Admitting: Otolaryngology

## 2023-10-09 VITALS — BP 144/85 | HR 55

## 2023-10-09 DIAGNOSIS — J343 Hypertrophy of nasal turbinates: Secondary | ICD-10-CM | POA: Diagnosis not present

## 2023-10-09 DIAGNOSIS — J310 Chronic rhinitis: Secondary | ICD-10-CM

## 2023-10-09 DIAGNOSIS — H93A3 Pulsatile tinnitus, bilateral: Secondary | ICD-10-CM

## 2023-10-09 DIAGNOSIS — J342 Deviated nasal septum: Secondary | ICD-10-CM

## 2023-10-09 DIAGNOSIS — H93A2 Pulsatile tinnitus, left ear: Secondary | ICD-10-CM

## 2023-10-09 DIAGNOSIS — R0981 Nasal congestion: Secondary | ICD-10-CM | POA: Diagnosis not present

## 2023-10-11 NOTE — Progress Notes (Signed)
 Patient ID: Michael Peters, male   DOB: 1965-02-07, 58 y.o.   MRN: [id number]  Follow-up: Chronic nasal obstruction, pulsatile tinnitus  HPI: The patient is a 58 year old male who returns today for his follow-up evaluation.  He was previously seen for chronic nasal obstruction and bilateral pulsatile tinnitus, worse on the left side.  He was noted to have nasal septal deviation and bilateral inferior turbinate hypertrophy.  He was treated with Flonase  nasal spray and nasal saline irrigation.  He also underwent an MRI scan to evaluate his pulsatile tinnitus.  The MRI scan was negative for retrocochlear lesion.  His CT angiography is pending.  The patient returns today reporting improvement in his nasal breathing.  He reports no change in his pulsatile tinnitus.  The patient has a family history of otosclerosis and Mnire's disease.  Currently he denies any otalgia, otorrhea, vertigo, facial pain, or fever.  Exam: General: Communicates without difficulty, well nourished, no acute distress. Head: Normocephalic, no evidence injury, no tenderness, facial buttresses intact without stepoff. Face/sinus: No tenderness to palpation and percussion. Facial movement is normal and symmetric. Eyes: PERRL, EOMI. No scleral icterus, conjunctivae clear. Neuro: CN II exam reveals vision grossly intact.  No nystagmus at any point of gaze. Ears: Auricles well formed without lesions.  Ear canals are intact without mass or lesion.  No erythema or edema is appreciated.  The TMs are intact without fluid. Nose: External evaluation reveals normal support and skin without lesions.  Dorsum is intact.  Anterior rhinoscopy reveals congested mucosa over anterior aspect of inferior turbinates and deviated septum.  No purulence noted. Oral:  Oral cavity and oropharynx are intact, symmetric, without erythema or edema.  Mucosa is moist without lesions. Neck: Full range of motion without pain.  There is no significant lymphadenopathy.  No masses  palpable.  Thyroid  bed within normal limits to palpation.  Parotid glands and submandibular glands equal bilaterally without mass.  Trachea is midline. Neuro:  CN 2-12 grossly intact.   Assessment: 1.  Chronic rhinitis with nasal mucosal congestion, nasal septal deviation, and bilateral inferior turbinate hypertrophy.  The severity of his nasal congestion has decreased. 2.  Bilateral pulsatile tinnitus, worse on the left side.  His recent MRI scan was negative for retrocochlear lesion.  His CT angiography is pending.  Plan: 1.  The physical exam findings are reviewed with the patient. 2.  Continue with Flonase  nasal spray and nasal saline irrigation. 3.  The strategies to cope with tinnitus are again discussed. 4.  The patient will return for reevaluation in 6 months, sooner if needed.

## 2023-10-18 ENCOUNTER — Telehealth (INDEPENDENT_AMBULATORY_CARE_PROVIDER_SITE_OTHER): Payer: Self-pay | Admitting: Otolaryngology

## 2023-10-18 NOTE — Telephone Encounter (Signed)
 Per Dr.Teoh, I spoke with patient and told him that his head CT/ Angiogram was normal. He will follow up at his regular scheduled appointment in 03/14/2024.

## 2023-10-31 ENCOUNTER — Other Ambulatory Visit: Payer: Self-pay | Admitting: Cardiology

## 2023-10-31 DIAGNOSIS — R002 Palpitations: Secondary | ICD-10-CM

## 2023-10-31 DIAGNOSIS — I10 Essential (primary) hypertension: Secondary | ICD-10-CM

## 2023-11-26 ENCOUNTER — Other Ambulatory Visit: Payer: Self-pay | Admitting: Cardiology

## 2023-11-26 DIAGNOSIS — I10 Essential (primary) hypertension: Secondary | ICD-10-CM

## 2023-11-26 DIAGNOSIS — R002 Palpitations: Secondary | ICD-10-CM

## 2024-01-10 ENCOUNTER — Telehealth: Payer: Self-pay | Admitting: Cardiology

## 2024-01-10 DIAGNOSIS — R002 Palpitations: Secondary | ICD-10-CM

## 2024-01-10 DIAGNOSIS — I10 Essential (primary) hypertension: Secondary | ICD-10-CM

## 2024-01-10 MED ORDER — BISOPROLOL FUMARATE 10 MG PO TABS: 10.0000 mg | ORAL_TABLET | Freq: Every day | ORAL | 0 refills | Status: AC

## 2024-01-10 NOTE — Telephone Encounter (Signed)
 Appt scheduled 03/22/24, refill sent to get pt to his appointment.

## 2024-01-10 NOTE — Telephone Encounter (Signed)
°*  STAT* If patient is at the pharmacy, call can be transferred to refill team.   1. Which medications need to be refilled? (please list name of each medication and dose if known) bisoprolol  (ZEBETA ) 10 MG tablet    2. Would you like to learn more about the convenience, safety, & potential cost savings by using the [facility]?     3. Are you open to using the [facility] (Type [facility].  ).   4. Which pharmacy/location (including street and city if local pharmacy) is medication to be sent to?  [id number] - [place], [place] - [address] AT [facility] OF [address]     5. Do they need a 30 day or 90 day supply? 90 day

## 2024-03-14 ENCOUNTER — Ambulatory Visit (INDEPENDENT_AMBULATORY_CARE_PROVIDER_SITE_OTHER): Admitting: Otolaryngology

## 2024-03-22 ENCOUNTER — Ambulatory Visit: Admitting: Cardiology
